# Patient Record
Sex: Male | Born: 1970 | Race: White | Hispanic: No | Marital: Married | Smoking: Never smoker
Health system: Southern US, Community
[De-identification: ages and names within clinical notes are randomized; demographics above are authoritative.]

## PROBLEM LIST (undated history)

## (undated) DIAGNOSIS — I2699 Other pulmonary embolism without acute cor pulmonale: Secondary | ICD-10-CM

## (undated) DIAGNOSIS — E119 Type 2 diabetes mellitus without complications: Secondary | ICD-10-CM

## (undated) DIAGNOSIS — M5126 Other intervertebral disc displacement, lumbar region: Secondary | ICD-10-CM

## (undated) DIAGNOSIS — I10 Essential (primary) hypertension: Secondary | ICD-10-CM

## (undated) DIAGNOSIS — Z9989 Dependence on other enabling machines and devices: Secondary | ICD-10-CM

## (undated) DIAGNOSIS — G4733 Obstructive sleep apnea (adult) (pediatric): Secondary | ICD-10-CM

## (undated) DIAGNOSIS — D6851 Activated protein C resistance: Secondary | ICD-10-CM

## (undated) DIAGNOSIS — Z86711 Personal history of pulmonary embolism: Secondary | ICD-10-CM

## (undated) DIAGNOSIS — I82409 Acute embolism and thrombosis of unspecified deep veins of unspecified lower extremity: Secondary | ICD-10-CM

## (undated) DIAGNOSIS — I82402 Acute embolism and thrombosis of unspecified deep veins of left lower extremity: Secondary | ICD-10-CM

## (undated) DIAGNOSIS — K760 Fatty (change of) liver, not elsewhere classified: Secondary | ICD-10-CM

## (undated) HISTORY — DX: Activated protein C resistance: D68.51

## (undated) HISTORY — DX: Fatty (change of) liver, not elsewhere classified: K76.0

## (undated) HISTORY — DX: Essential (primary) hypertension: I10

## (undated) HISTORY — DX: Personal history of pulmonary embolism: Z86.711

## (undated) HISTORY — DX: Dependence on other enabling machines and devices: Z99.89

## (undated) HISTORY — DX: Obstructive sleep apnea (adult) (pediatric): G47.33

## (undated) HISTORY — DX: Acute embolism and thrombosis of unspecified deep veins of left lower extremity: I82.402

---

## 2004-06-11 ENCOUNTER — Emergency Department (HOSPITAL_COMMUNITY): Admission: EM | Admit: 2004-06-11 | Discharge: 2004-06-11 | Payer: Self-pay | Admitting: Emergency Medicine

## 2013-06-29 ENCOUNTER — Other Ambulatory Visit: Payer: Self-pay | Admitting: Family Medicine

## 2013-06-29 ENCOUNTER — Ambulatory Visit
Admission: RE | Admit: 2013-06-29 | Discharge: 2013-06-29 | Disposition: A | Discharge status: Home / Self Care | Payer: BC Managed Care – PPO | Source: Ambulatory Visit | Attending: Family Medicine | Admitting: Family Medicine

## 2013-06-29 ENCOUNTER — Other Ambulatory Visit (HOSPITAL_COMMUNITY): Payer: Self-pay | Admitting: Family Medicine

## 2013-06-29 ENCOUNTER — Ambulatory Visit (HOSPITAL_COMMUNITY)
Admission: RE | Admit: 2013-06-29 | Discharge: 2013-06-29 | Disposition: A | Discharge status: Home / Self Care | Payer: BC Managed Care – PPO | Source: Ambulatory Visit | Attending: Family Medicine | Admitting: Family Medicine

## 2013-06-29 DIAGNOSIS — R0602 Shortness of breath: Secondary | ICD-10-CM

## 2013-06-29 DIAGNOSIS — I2699 Other pulmonary embolism without acute cor pulmonale: Secondary | ICD-10-CM | POA: Insufficient documentation

## 2013-06-29 DIAGNOSIS — M79662 Pain in left lower leg: Secondary | ICD-10-CM

## 2013-06-29 DIAGNOSIS — R7989 Other specified abnormal findings of blood chemistry: Secondary | ICD-10-CM

## 2013-06-29 DIAGNOSIS — M79609 Pain in unspecified limb: Secondary | ICD-10-CM

## 2013-06-29 DIAGNOSIS — M7989 Other specified soft tissue disorders: Secondary | ICD-10-CM

## 2013-06-29 MED ORDER — IOHEXOL 350 MG/ML SOLN
125.0000 mL | Freq: Once | INTRAVENOUS | Status: AC | PRN
  Administered 2013-06-29: 125 mL via INTRAVENOUS

## 2013-06-29 NOTE — Progress Notes (Signed)
VASCULAR LAB PRELIMINARY  PRELIMINARY  PRELIMINARY  PRELIMINARY  Left lower extremity venous duplex completed.    Preliminary report:  Left: DVT noted in the posterior tibial vein ankle to mid calf.  No evidence of superficial thrombosis.  No Baker's cyst.  Ceazia Harb, RVS 06/29/2013, 4:04 PM

## 2013-08-14 ENCOUNTER — Telehealth: Payer: Self-pay | Admitting: Oncology

## 2013-08-14 NOTE — Telephone Encounter (Signed)
lvom for pt to return call in re to referral.  °

## 2014-04-18 ENCOUNTER — Telehealth: Payer: Self-pay | Admitting: Internal Medicine

## 2014-04-18 NOTE — Telephone Encounter (Signed)
left message for patient to return call to schedule np appt.  °

## 2014-04-19 NOTE — Telephone Encounter (Signed)
LEFT MESSAGE FOR PATIENT TO RETURN CALL TO SCHEDULE NP APPT.  °

## 2014-06-23 ENCOUNTER — Emergency Department (HOSPITAL_BASED_OUTPATIENT_CLINIC_OR_DEPARTMENT_OTHER): Payer: BC Managed Care – PPO

## 2014-06-23 ENCOUNTER — Encounter (HOSPITAL_BASED_OUTPATIENT_CLINIC_OR_DEPARTMENT_OTHER): Payer: Self-pay | Admitting: Emergency Medicine

## 2014-06-23 ENCOUNTER — Emergency Department (HOSPITAL_BASED_OUTPATIENT_CLINIC_OR_DEPARTMENT_OTHER)
Admission: EM | Admit: 2014-06-23 | Discharge: 2014-06-23 | Disposition: A | Discharge status: Home / Self Care | Payer: BC Managed Care – PPO | Attending: Emergency Medicine | Admitting: Emergency Medicine

## 2014-06-23 DIAGNOSIS — R1012 Left upper quadrant pain: Secondary | ICD-10-CM | POA: Insufficient documentation

## 2014-06-23 DIAGNOSIS — R079 Chest pain, unspecified: Secondary | ICD-10-CM | POA: Diagnosis present

## 2014-06-23 DIAGNOSIS — R071 Chest pain on breathing: Secondary | ICD-10-CM | POA: Diagnosis not present

## 2014-06-23 DIAGNOSIS — Z880 Allergy status to penicillin: Secondary | ICD-10-CM | POA: Insufficient documentation

## 2014-06-23 DIAGNOSIS — Z86711 Personal history of pulmonary embolism: Secondary | ICD-10-CM | POA: Diagnosis not present

## 2014-06-23 DIAGNOSIS — Z79899 Other long term (current) drug therapy: Secondary | ICD-10-CM | POA: Insufficient documentation

## 2014-06-23 DIAGNOSIS — I10 Essential (primary) hypertension: Secondary | ICD-10-CM | POA: Insufficient documentation

## 2014-06-23 DIAGNOSIS — R0789 Other chest pain: Secondary | ICD-10-CM | POA: Insufficient documentation

## 2014-06-23 DIAGNOSIS — Z86718 Personal history of other venous thrombosis and embolism: Secondary | ICD-10-CM | POA: Diagnosis not present

## 2014-06-23 HISTORY — DX: Other pulmonary embolism without acute cor pulmonale: I26.99

## 2014-06-23 HISTORY — DX: Acute embolism and thrombosis of unspecified deep veins of unspecified lower extremity: I82.409

## 2014-06-23 HISTORY — DX: Essential (primary) hypertension: I10

## 2014-06-23 LAB — CBC WITH DIFFERENTIAL/PLATELET
Basophils Absolute: 0 10*3/uL (ref 0.0–0.1)
Basophils Relative: 0 % (ref 0–1)
Eosinophils Absolute: 0.1 10*3/uL (ref 0.0–0.7)
Eosinophils Relative: 2 % (ref 0–5)
HCT: 45.4 % (ref 39.0–52.0)
Hemoglobin: 15.5 g/dL (ref 13.0–17.0)
LYMPHS ABS: 2.7 10*3/uL (ref 0.7–4.0)
LYMPHS PCT: 31 % (ref 12–46)
MCH: 31.2 pg (ref 26.0–34.0)
MCHC: 34.1 g/dL (ref 30.0–36.0)
MCV: 91.3 fL (ref 78.0–100.0)
Monocytes Absolute: 0.5 10*3/uL (ref 0.1–1.0)
Monocytes Relative: 6 % (ref 3–12)
NEUTROS PCT: 61 % (ref 43–77)
Neutro Abs: 5.4 10*3/uL (ref 1.7–7.7)
Platelets: 325 10*3/uL (ref 150–400)
RBC: 4.97 MIL/uL (ref 4.22–5.81)
RDW: 12.4 % (ref 11.5–15.5)
WBC: 8.7 10*3/uL (ref 4.0–10.5)

## 2014-06-23 LAB — TROPONIN I

## 2014-06-23 LAB — LIPASE, BLOOD: Lipase: 26 U/L (ref 11–59)

## 2014-06-23 LAB — PROTIME-INR
INR: 1.75 — AB (ref 0.00–1.49)
Prothrombin Time: 20.4 seconds — ABNORMAL HIGH (ref 11.6–15.2)

## 2014-06-23 LAB — URINALYSIS, ROUTINE W REFLEX MICROSCOPIC
Bilirubin Urine: NEGATIVE
Glucose, UA: NEGATIVE mg/dL
Ketones, ur: NEGATIVE mg/dL
LEUKOCYTES UA: NEGATIVE
NITRITE: NEGATIVE
PH: 5 (ref 5.0–8.0)
Protein, ur: NEGATIVE mg/dL
Specific Gravity, Urine: 1.011 (ref 1.005–1.030)
Urobilinogen, UA: 0.2 mg/dL (ref 0.0–1.0)

## 2014-06-23 LAB — COMPREHENSIVE METABOLIC PANEL
ALT: 30 U/L (ref 0–53)
AST: 29 U/L (ref 0–37)
Albumin: 4.1 g/dL (ref 3.5–5.2)
Alkaline Phosphatase: 48 U/L (ref 39–117)
Anion gap: 15 (ref 5–15)
BUN: 15 mg/dL (ref 6–23)
CO2: 22 meq/L (ref 19–32)
Calcium: 10.1 mg/dL (ref 8.4–10.5)
Chloride: 101 mEq/L (ref 96–112)
Creatinine, Ser: 1 mg/dL (ref 0.50–1.35)
GFR calc Af Amer: >90 mL/min (ref >90)
GLUCOSE: 167 mg/dL — AB (ref 70–99)
Potassium: 4.2 mEq/L (ref 3.7–5.3)
SODIUM: 138 meq/L (ref 137–147)
TOTAL PROTEIN: 8 g/dL (ref 6.0–8.3)
Total Bilirubin: 0.4 mg/dL (ref 0.3–1.2)

## 2014-06-23 LAB — URINE MICROSCOPIC-ADD ON

## 2014-06-23 MED ORDER — IOHEXOL 350 MG/ML SOLN
100.0000 mL | Freq: Once | INTRAVENOUS | Status: AC | PRN
  Administered 2014-06-23: 100 mL via INTRAVENOUS

## 2014-06-23 NOTE — ED Provider Notes (Signed)
CSN: [id number]     Arrival date & time 06/23/14  1135 History   First MD Initiated Contact with Patient 06/23/14 1145    This chart was scribed for Glynn Octave, MD by Freida Busman, ED Scribe. This patient was seen in room MHOTF/OTF and the patient's care was started 3:58 PM.  Chief Complaint  Patient presents with  . Chest Pain      The history is provided by the patient.   HPI Comments:  Tyreon Frigon is a 43 y.o. male who presents to the Emergency Department complaining of constant moderate right sided chest pain that started this am. Pain started while helping his son get dressed, states he feels like he pulled a muscle. Pt also admits to working out yesterday but does not believe pain is related. Pain is exacerbated by certain movements and with inspiration . No alleviating factors noted.  Pt notes h/o PE, is currently on Xarelto and is compliant with med. Pt also complains of constant left sided rib pain/LUQ abdominal pain that radiates around to left flank with onset "a few weeks ago".    Past Medical History  Diagnosis Date  . Pulmonary embolism   . DVT (deep venous thrombosis)   . Hypertension    History reviewed. No pertinent past surgical history. No family history on file. History  Substance Use Topics  . Smoking status: Never Smoker   . Smokeless tobacco: Not on file  . Alcohol Use: No    Review of Systems  Cardiovascular: Positive for chest pain.  Gastrointestinal: Positive for abdominal pain.  All other systems reviewed and are negative.     Allergies  Amoxicillin  Home Medications   Prior to Admission medications   Medication Sig Start Date End Date Taking? Authorizing Provider  amLODipine (NORVASC) 10 MG tablet Take 10 mg by mouth daily.   Yes Historical Provider, MD  rivaroxaban (XARELTO) 10 MG TABS tablet Take 10 mg by mouth daily.   Yes Historical Provider, MD   BP 143/66  Pulse 67  Resp 18  SpO2 98% Physical Exam  Nursing note and vitals  reviewed. Constitutional: He is oriented to person, place, and time. He appears well-developed and well-nourished. No distress.      HENT:  Head: Normocephalic and atraumatic.  Mouth/Throat: Oropharynx is clear and moist. No oropharyngeal exudate.  Eyes: Conjunctivae and EOM are normal. Pupils are equal, round, and reactive to light.  Neck: Normal range of motion. Neck supple.  No meningismus.  Cardiovascular: Normal rate, regular rhythm, normal heart sounds and intact distal pulses.   No murmur heard. Distal pulses intact  Pulmonary/Chest: Effort normal and breath sounds normal. No respiratory distress.  Right chest wall tenderness worse with palpation and arm movement  Abdominal: Soft. There is no rebound and no guarding.  Mild LUQ and left flank tenderness  Musculoskeletal: Normal range of motion. He exhibits no edema and no tenderness.  Neurological: He is alert and oriented to person, place, and time. No cranial nerve deficit. He exhibits normal muscle tone. Coordination normal.  No ataxia on finger to nose bilaterally. No pronator drift. 5/5 strength throughout. CN 2-12 intact. Negative Romberg. Equal grip strength. Sensation intact. Gait is normal.   Skin: Skin is warm.  Psychiatric: He has a normal mood and affect. His behavior is normal.    ED Course  Procedures (including critical care time)  DIAGNOSTIC STUDIES:  Oxygen Saturation is 100% on RA, normal  by my interpretation.    COORDINATION OF  CARE:  3:58 PM Discussed treatment plan with pt at bedside and pt agreed to plan.  Labs Review Labs Reviewed  COMPREHENSIVE METABOLIC PANEL - Abnormal; Notable for the following:    Glucose, Bld 167 (*)    All other components within normal limits  URINALYSIS, ROUTINE W REFLEX MICROSCOPIC - Abnormal; Notable for the following:    Hgb urine dipstick TRACE (*)    All other components within normal limits  PROTIME-INR - Abnormal; Notable for the following:    Prothrombin Time  20.4 (*)    INR 1.75 (*)    All other components within normal limits  CBC WITH DIFFERENTIAL  LIPASE, BLOOD  TROPONIN I  URINE MICROSCOPIC-ADD ON    Imaging Review Dg Chest 2 View  06/23/2014   CLINICAL DATA:  Right-sided chest pain.  EXAM: CHEST  2 VIEW  COMPARISON:  Chest CT 06/29/2013.  FINDINGS: Lung volumes are normal. No consolidative airspace disease. No pleural effusions. No pneumothorax. No pulmonary nodule or mass noted. Pulmonary vasculature and the cardiomediastinal silhouette are within normal limits.  IMPRESSION: No radiographic evidence of acute cardiopulmonary disease.   Electronically Signed   By: Daniel  Entrikin M.D.   On: 06/23/2014 13:00   Ct Angio Chest Pe W/cm &/or Wo Cm  06/23/2014   CLINICAL DATA:  Right-sided chest pain and abdominal pain  EXAM: CT ANGIOGRAPHY CHEST  CT ABDOMEN AND PELVIS WITH CONTRAST  TECHNIQUE: Multidetector CT imaging of the chest was performed using the standard protocol during bolus administration of intravenous contrast. Multiplanar CT image reconstructions and MIPs were obtained to evaluate the vascular anatomy. Multidetector CT imaging of the abdomen and pelvis was performed using the standard protocol during bolus administration of intravenous contrast.  CONTRAST:  <MEASUREMEAkron General Medical CenTrous69daERaef<MEASUREMENTVermilion Behavioral Health SysHospital Fo13r [place] Raef<MEASUREMENTUniversity HospiOutpatient25 Raef<MEASUREMENTCom[facility] Health[facility] Rehabilitation7 HKnights LRaef<MEASUREMENT[facility] Partner41s CRaef<MEASUREMENTMercy WestbrTa47hRaef<MEASUREMENTAsh[facility] Medical CenPhysicians 110BeHockiRaef<MEASUREME69N<MEASUREMENT[facility] Health[facility] Rehabilitation43 HCornRaef<MEASUREMENTGreater Ny Endoscopy Surgical CenCts Surgical Associates LLC Dba Cedar T51reNew BerliRaef<MEASUREMENT[facility] CenUpper Bay18 SGreeleRaef<MEASUREMENTM[facility] County HospiM3erRaef<MEASUREMENTKiowa District HospiFran46klBlRaef<MEASUREMENTRenue Surgery Center Of Wayc37rCRaef<MEASUREMENTCascade Eye And Skin CentersHospital Distri4ctAmitRaef<MEASUREMENTThe Physicians' Hospital In AnadaDigestive Health E67ndMobilRaef<MEASUREMENT[facility] H Noye9Raef<MEASUREMENTMclaren Greater LansStrategic Behav52ioPlattsburgRaef<MEASUREMENTW[facility] Stadium CamBanner Gat79ewRaef<MEASUREMENT[facility] Blue BThe University Of [facility] 69PhBird Raef<MEASUREMENTSouthwest General HospiJackso26Raef<MEASUREMENTEye Care Surgery Center SouthaHan18seTainteRaef<MEASUREMENT[facility] Luke'[facility] CamAmery 74HRaef<MEASUREMENTS[facility] Facility [facility] Health Servi61ceMeaRaef<MEASUREMENTPrairie Community HospiHealtheas48t CaRaef<MEASUREMENTSt Jaeven HealthcUnion 69HeCarmel-by-tRaef<MEASUREMENTJohnson County Surgery CenterDelt3a LandRaef<MEASUREMENTNyu Winthrop-University HospiWeste31rnSpearRaef50981Carollee HerterOL 350 MG/ML SOLN  COMPARISON:  None.  FINDINGS: CTA CHEST FINDINGS  There is no pleural effusion identified. No airspace consolidation, atelectasis or mass identified.  The heart size appears normal. The trachea is midline and patent. The esophagus appears normal. No enlarged mediastinal or hilar lymph nodes. There is no axillary or supraclavicular adenopathy.  The main pulmonary artery appears patent. No lobar or segmental pulmonary artery filling defects identified to suggest an acute pulmonary embolus.  Review of the visualized bony structures is unremarkable. No aggressive lytic or sclerotic bone lesion identified.  Review of the MIP images confirms the above  findings.  CT ABDOMEN and PELVIS FINDINGS  Mild hepatic steatosis noted. There is no suspicious liver abnormality. The gallbladder appears normal. No biliary dilatation. Normal appearance of the pancreas and spleen.  The adrenal glands are both normal. Normal appearance of the right kidney. The left kidney is normal. The urinary bladder is normal. Mild prostate gland enlargement noted.  Normal caliber of the abdominal aorta. Porta hepatis lymph node measures 1.3 cm, image 23/series 13. No additional enlarged upper abdominal lymph nodes. No mesenteric adenopathy. There is no pelvic or inguinal adenopathy. No free fluid or fluid collections within the abdomen or pelvis.  The stomach appears normal. The proximal small bowel loops have a normal course and caliber. The distal small bowel including terminal ileum appear normal. The appendix is visualized and is unremarkable. Normal appearance of the proximal colon. The distal colon and rectum are unremarkable.  Small fat containing inguinal hernias noted. 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 No acute bone abnormalities identified.  IMPRESSION:  1. No acute cardiopulmonary abnormalities. No evidence for pulmonary embolus. 2.  No acute findings identified within the abdomen or pelvis. 3. Mild hepatic steatosis. 4. Borderline enlarged porta hepatis lymph node.   Electronically Signed   By: Signa Kell M.D.   On: 06/23/2014 13:58   Ct Abdomen Pelvis W Contrast  06/23/2014   CLINICAL DATA:  Right-sided chest pain and abdominal pain  EXAM: CT ANGIOGRAPHY CHEST  CT ABDOMEN AND PELVIS WITH CONTRAST  TECHNIQUE: Multidetector CT imaging of the chest was performed using the standard protocol during bolus administration of intravenous contrast. Multiplanar CT image reconstructions and MIPs were obtained to evaluate the vascular anatomy. Multidetector CT imaging of the abdomen and pelvis was  performed using the standard protocol during bolus administration of intravenous contrast.  CONTRAST:  OMNIPAQUE IOHEXOL 350 MG/ML SOLN  COMPARISON:  None.  FINDINGS: CTA CHEST FINDINGS  There is no pleural effusion identified. No airspace consolidation, atelectasis or mass identified.  The heart size appears normal. The trachea is midline and patent. The esophagus appears normal. No enlarged mediastinal or hilar lymph nodes. There is no axillary or supraclavicular adenopathy.  The main pulmonary artery appears patent. No lobar or segmental pulmonary artery filling defects identified to suggest an acute pulmonary embolus.  Review of the visualized bony structures is unremarkable. No aggressive lytic or sclerotic bone lesion identified.  Review of the MIP images confirms the above findings.  CT ABDOMEN and PELVIS FINDINGS  Mild hepatic steatosis noted. There is no suspicious liver abnormality. The gallbladder appears normal. No biliary dilatation. Normal appearance of the pancreas and spleen.  The adrenal glands are both normal. Normal appearance of the right kidney. The left kidney is normal. The urinary bladder is normal. Mild prostate gland enlargement noted.  Normal caliber of the abdominal aorta. Porta hepatis lymph node measures 1.3 cm, image 23/series 13. No additional enlarged upper abdominal lymph nodes. No mesenteric adenopathy. There is no pelvic or inguinal adenopathy. No free fluid or fluid collections within the abdomen or pelvis.  The stomach appears normal. The proximal small bowel loops have a normal course and caliber. The distal small bowel including terminal ileum appear normal. The appendix is visualized and is unremarkable. Normal appearance of the proximal colon. The distal colon and rectum are unremarkable.  Small fat containing inguinal hernias noted. 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 No acute bone  abnormalities identified.  IMPRESSION:  1. No acute cardiopulmonary abnormalities. No evidence for pulmonary embolus. 2. No acute findings identified within the abdomen or pelvis. 3. Mild hepatic steatosis. 4. Borderline enlarged porta hepatis lymph node.   Electronically Signed   By: Signa Kell M.D.   On: 06/23/2014 13:58     EKG Interpretation   Date/Time:  Sunday June 23 2014 11:42:28 EDT Ventricular Rate:  86 PR Interval:  134 QRS Duration: 90 QT Interval:  358 QTC Calculation: 428 R Axis:   65 Text Interpretation:  Normal sinus rhythm Normal ECG No previous ECGs  available Confirmed by Manus Gunning  MD, Beckett Maden [phone]) on 06/23/2014 12:18:20  PM      MDM   Final diagnoses:  Atypical chest pain   Right-sided chest pain constant since this morning after moving his disabled son. Also worked out yesterday. Pain worse with palpation and movement of right arm. History of PE on xarelto.  Second complaint is left-sided flank abdominal pain has been coming and going for 3 weeks. No vomiting.  EKG nsr. Chest xray negative. Doubt PE as patient  on xarelto and states compliance.  Atypical for ACS, right sided and reproducible. Trace hematuria in UA. Question passed stone as source of flank pain. CT negative for PE or any other abnormality.  Pain and going for the past 5 hours and constant. Negative troponin. Low suspicion for ACS. No evidence of PE, pneumothorax or pneumonia. Supportive care for likely chest wall pain. Continue xarelto. Followup with PCP. Return precautions discussed.  BP 143/66  Pulse 67  Resp 18  SpO2 98%      I personally performed the services described in this documentation, which was scribed in my presence. The recorded information has been reviewed and is accurate.    Glynn Octave, MD 06/23/14 [phone]

## 2014-06-23 NOTE — ED Notes (Signed)
CP since last night. Reports worked out last night, feeling aching pain in right side of chest. Dx with PE last year. No radiation. Reports nausea. Also sts back pain

## 2014-06-23 NOTE — Discharge Instructions (Signed)
Chest Pain (Nonspecific) °There is no evidence of heart attack or blood clot in the lung. Follow up with your doctor. Return to the ED if you develop new or worsening symptoms. °It is often hard to give a specific diagnosis for the cause of chest pain. There is always a chance that your pain could be related to something serious, such as a heart attack or a blood clot in the lungs. You need to follow up with your health care provider for further evaluation. °CAUSES  °· Heartburn. °· Pneumonia or bronchitis. °· Anxiety or stress. °· Inflammation around your heart (pericarditis) or lung (pleuritis or pleurisy). °· A blood clot in the lung. °· A collapsed lung (pneumothorax). It can develop suddenly on its own (spontaneous pneumothorax) or from trauma to the chest. °· Shingles infection (herpes zoster virus). °The chest wall is composed of bones, muscles, and cartilage. Any of these can be the source of the pain. °· The bones can be bruised by injury. °· The muscles or cartilage can be strained by coughing or overwork. °· The cartilage can be affected by inflammation and become sore (costochondritis). °DIAGNOSIS  °Lab tests or other studies may be needed to find the cause of your pain. Your health care provider may have you take a test called an ambulatory electrocardiogram (ECG). An ECG records your heartbeat patterns over a 24-hour period. You may also have other tests, such as: °· Transthoracic echocardiogram (TTE). During echocardiography, sound waves are used to evaluate how blood flows through your heart. °· Transesophageal echocardiogram (TEE). °· Cardiac monitoring. This allows your health care provider to monitor your heart rate and rhythm in real time. °· Holter monitor. This is a portable device that records your heartbeat and can help diagnose heart arrhythmias. It allows your health care provider to track your heart activity for several days, if needed. °· Stress tests by exercise or by giving medicine  that makes the heart beat faster. °TREATMENT  °· Treatment depends on what may be causing your chest pain. Treatment may include: °¨ Acid blockers for heartburn. °¨ Anti-inflammatory medicine. °¨ Pain medicine for inflammatory conditions. °¨ Antibiotics if an infection is present. °· You may be advised to change lifestyle habits. This includes stopping smoking and avoiding alcohol, caffeine, and chocolate. °· You may be advised to keep your head raised (elevated) when sleeping. This reduces the chance of acid going backward from your stomach into your esophagus. °Most of the time, nonspecific chest pain will improve within 2-3 days with rest and mild pain medicine.  °HOME CARE INSTRUCTIONS  °· If antibiotics were prescribed, take them as directed. Finish them even if you start to feel better. °· For the next few days, avoid physical activities that bring on chest pain. Continue physical activities as directed. °· Do not use any tobacco products, including cigarettes, chewing tobacco, or electronic cigarettes. °· Avoid drinking alcohol. °· Only take medicine as directed by your health care provider. °· Follow your health care provider's suggestions for further testing if your chest pain does not go away. °· Keep any follow-up appointments you made. If you do not go to an appointment, you could develop lasting (chronic) problems with pain. If there is any problem keeping an appointment, call to reschedule. °SEEK MEDICAL CARE IF:  °· Your chest pain does not go away, even after treatment. °· You have a rash with blisters on your chest. °· You have a fever. °SEEK IMMEDIATE MEDICAL CARE IF:  °· You have increased   chest pain or pain that spreads to your arm, neck, jaw, back, or abdomen. °· You have shortness of breath. °· You have an increasing cough, or you cough up blood. °· You have severe back or abdominal pain. °· You feel nauseous or vomit. °· You have severe weakness. °· You faint. °· You have chills. °This is an  emergency. Do not wait to see if the pain will go away. Get medical help at once. Call your local emergency services (911 in [place] Do not drive yourself to the hospital. °MAKE SURE YOU:  °· Understand these instructions. °· Will watch your condition. °· Will get help right away if you are not doing well or get worse. °Document Released: 07/07/2005 Document Revised: 10/02/2013 Document Reviewed: 05/02/2008 °[employer]® Patient Information ©2015 [employer]. This information is not intended to replace advice given to you by your health care provider. Make sure you discuss any questions you have with your health care provider. ° °

## 2014-06-23 NOTE — ED Notes (Signed)
MD at bedside. 

## 2014-06-23 NOTE — ED Notes (Signed)
Patient transported to CT 

## 2015-02-13 ENCOUNTER — Telehealth: Payer: Self-pay | Admitting: Oncology

## 2015-02-13 NOTE — Telephone Encounter (Signed)
Call to patient to confirm appointment for 02/17/15 at 2:30 lmtcb

## 2015-02-17 ENCOUNTER — Ambulatory Visit (INDEPENDENT_AMBULATORY_CARE_PROVIDER_SITE_OTHER): Payer: BC Managed Care – PPO | Admitting: Oncology

## 2015-02-17 ENCOUNTER — Encounter: Payer: Self-pay | Admitting: Oncology

## 2015-02-17 VITALS — BP 138/74 | HR 74 | Temp 98.2°F | Ht 69.5 in | Wt 275.9 lb

## 2015-02-17 DIAGNOSIS — D6851 Activated protein C resistance: Secondary | ICD-10-CM | POA: Insufficient documentation

## 2015-02-17 DIAGNOSIS — I824Z2 Acute embolism and thrombosis of unspecified deep veins of left distal lower extremity: Secondary | ICD-10-CM | POA: Diagnosis not present

## 2015-02-17 DIAGNOSIS — Z9989 Dependence on other enabling machines and devices: Secondary | ICD-10-CM

## 2015-02-17 DIAGNOSIS — G4733 Obstructive sleep apnea (adult) (pediatric): Secondary | ICD-10-CM

## 2015-02-17 DIAGNOSIS — I82402 Acute embolism and thrombosis of unspecified deep veins of left lower extremity: Secondary | ICD-10-CM

## 2015-02-17 DIAGNOSIS — Z7901 Long term (current) use of anticoagulants: Secondary | ICD-10-CM

## 2015-02-17 DIAGNOSIS — K760 Fatty (change of) liver, not elsewhere classified: Secondary | ICD-10-CM

## 2015-02-17 DIAGNOSIS — I2699 Other pulmonary embolism without acute cor pulmonale: Secondary | ICD-10-CM | POA: Diagnosis not present

## 2015-02-17 DIAGNOSIS — I10 Essential (primary) hypertension: Secondary | ICD-10-CM

## 2015-02-17 DIAGNOSIS — Z86711 Personal history of pulmonary embolism: Secondary | ICD-10-CM

## 2015-02-17 HISTORY — DX: Acute embolism and thrombosis of unspecified deep veins of left lower extremity: I82.402

## 2015-02-17 HISTORY — DX: Fatty (change of) liver, not elsewhere classified: K76.0

## 2015-02-17 HISTORY — DX: Essential (primary) hypertension: I10

## 2015-02-17 HISTORY — DX: Activated protein C resistance: D68.51

## 2015-02-17 HISTORY — DX: Obstructive sleep apnea (adult) (pediatric): G47.33

## 2015-02-17 HISTORY — DX: Personal history of pulmonary embolism: Z86.711

## 2015-02-17 NOTE — Patient Instructions (Signed)
Consider decrease in Xarelto to 10 mg daily Return visit with Dr Reece AgarG in 1 year

## 2015-02-17 NOTE — Progress Notes (Addendum)
Patient ID: Connor Little, male   DOB: 1971-06-22, 44 y.o.   MRN: [id number] New Patient Hematology   Connor Little [id number] 1971-06-22 44 y.o. 02/17/2015  CC: Dr. Sigmund HazelLisa Miller   Reason for referral: Advise on duration of anticoagulation   HPI:  44 year old man who has been in overall good health except for treated hypertension and obstructive sleep apnea. He has been using CPAP for the last 2 years. He presented on 06/29/2013 with a 3 to four-week history of rapidly progressive dyspnea with minimal exertion and a 1-2 week history of left calf pain. Vascular Doppler studies showed acute DVT in the posterior tibial vein to mid calf. More impressively, CT angiogram of the chest showed bilateral pulmonary emboli including clot in the left and right main pulmonary arteries. He had no history of immobilization, surgery, prolonged travel, or previous blood clots. He is a nonsmoker. No family history of clotting in his parents or 2 siblings. A paternal uncle approximately 42 years old recently had a PE. He was treated as an outpatient with Xarelto, current dose 20 mg daily, which she has been on since diagnosis. He was found to be a heterozygote for the factor V Leiden gene mutation. No other family members were tested.  He presented to the emergency department on 06/23/2014 for evaluation of chest pain. CT angios the chest did not show any new emboli at that time. Pain was felt to be musculoskeletal in nature.  He denies any current dyspnea, chest pain, palpitations, leg pain or swelling. He has no constitutional symptoms.    PMH: Past Medical History  Diagnosis Date  . Pulmonary embolism   . DVT (deep venous thrombosis)   . Hypertension   Obstructive sleep apnea as noted above. Question of renal stones. Emergency room visit for flank pain and transient hematuria with negative CT scan. No history of MI, diabetes, asthma, emphysema, tuberculosis, hepatitis, yellow jaundice, mononucleosis, thyroid  disease, prostate problems, inflammatory arthritis, seizure, or stroke.  No past surgical history on file.  Allergies: Allergies  Allergen Reactions  . Amoxicillin     Medications:  Current outpatient prescriptions:  .  amLODipine (NORVASC) 10 MG tablet, Take 10 mg by mouth daily., Disp: , Rfl:  .  rivaroxaban (XARELTO) 10 MG TABS tablet, Take 20 mg by mouth daily. , Disp: , Rfl:    Social History: Married. He has an 42-year-old son who has the fragile X syndrome. His wife is the carrier for this gene defect. 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  he has never smoked.  He reports that he drinks alcohol very rarely. He does not use any recreational drugs..  Family History: Mother age 42, father age 42, both healthy. Brother age 42, sister age 42 both healthy. His sister has had 3 children with 3 uncomplicated pregnancies.   Review of Systems: See HPI Remaining ROS negative.  Physical Exam: Blood pressure 138/74, pulse 74, temperature 98.2 F (36.8 C), temperature source Oral, height 5' 9.5" (1.765 m), weight 275 lb 14.4 oz (125.147 kg), SpO2 98 %. Wt Readings from Last 3 Encounters:  02/17/15 275 lb 14.4 oz (125.147 kg)     General appearance: Husky Caucasian man HENNT: Pharynx no erythema, exudate, mass, or ulcer. No thyromegaly or thyroid nodules Lymph nodes: No cervical, supraclavicular, or axillary lymphadenopathy Breasts:  Lungs: Clear to auscultation, resonant to percussion throughout Heart: Regular rhythm, no murmur, no gallop, no rub, no click, no edema Abdomen: Soft, nontender, normal bowel sounds, no  mass, no organomegaly Extremities: No edema, no calf tenderness Musculoskeletal: no joint deformities GU:  Vascular: Carotid pulses 2+, no bruits,  Neurologic: Alert, oriented, PERRLA, optic discs sharp and vessels normal, no hemorrhage or exudate, cranial nerves grossly normal, motor strength 5 over 5, reflexes 1+ symmetric, upper body  coordination normal, gait normal, Skin: No rash or ecchymosis    Lab Results: Lab Results  Component Value Date   WBC 8.7 06/23/2014   HGB 15.5 06/23/2014   HCT 45.4 06/23/2014   MCV 91.3 06/23/2014   PLT 325 06/23/2014     Chemistry      Component Value Date/Time   NA 138 06/23/2014 1150   K 4.2 06/23/2014 1150   CL 101 06/23/2014 1150   CO2 22 06/23/2014 1150   BUN 15 06/23/2014 1150   CREATININE 1.00 06/23/2014 1150      Component Value Date/Time   CALCIUM 10.1 06/23/2014 1150   ALKPHOS 48 06/23/2014 1150   AST 29 06/23/2014 1150   ALT 30 06/23/2014 1150   BILITOT 0.4 06/23/2014 1150       Radiological Studies: See discussion above     Impression:  Unprovoked bilateral pulmonary emboli and left lower extremity distal DVT with no obvious risk factors except for heterozygote status for the factor V Leiden gene mutation and possibly, sleep apnea syndrome.  Recommendation: We had an extended discussion about current approach to unprovoked venous thromboembolism. A number of studies reproducibly show an unacceptable recurrence rate in people who stop anticoagulation. Current guidelines recommend long-term anticoagulation with annual review of clotting versus bleeding risk. The non-Coumadin oral anticoagulants are now the recommended anticoagulants of choice for patients that need to be on long-term anticoagulation in view of a lower bleeding risk than long-term Coumadin. There is one provocative study in the literature where patients who are taking apixiban (Eliquis) for one year after their pulmonary emboli were randomized to observation versus continuing full dose apixiban versus changing to prophylactic dose which is 50% of the therapeutic dose. This study confirmed the unacceptably high rethrombosis rate in the patient's on the observation arm but showed that the patients who were randomized to the lower dose of the apixiban still have full protection against  rethrombosis with no major bleeding. Although this is only a single study, and I reinforced this to the patient, I do think it would be quite reasonable now that he has been on the Xarelto for almost 2 years, to change to Xarelto had a lower dose of 10 milligrams daily. He really likes this idea and understands that we don't have a lot of data except for the one study and with a different drug than he is taking. I will see him again in a year. He understands he can call if there are any problems in the interim.  We no longer recommend routine testing of other family members for genetic predisposing factor such as factor V Leiden unless there are extenuating circumstances.      Connor FeinsteinJAMES M Julien Berryman, MD 02/17/2015, 4:04 PM  Additional lab results: Prothrombin gene mutation: not detected. ANA negative. Anticardiolipin antibodies not elevated. Plasma homocysteine normal.  Protein C functional 143% (74-151). Antithrombin III normal. Activated Protein C resistance: 1.9 (2.0-4.0 ratio)

## 2016-03-29 ENCOUNTER — Encounter: Payer: Self-pay | Admitting: Oncology

## 2016-03-29 ENCOUNTER — Ambulatory Visit: Payer: BC Managed Care – PPO | Admitting: Oncology

## 2017-10-26 ENCOUNTER — Encounter (HOSPITAL_COMMUNITY): Payer: Self-pay | Admitting: *Deleted

## 2017-10-26 ENCOUNTER — Emergency Department (HOSPITAL_COMMUNITY)
Admission: EM | Admit: 2017-10-26 | Discharge: 2017-10-26 | Disposition: A | Discharge status: Home / Self Care | Payer: BC Managed Care – PPO | Attending: Emergency Medicine | Admitting: Emergency Medicine

## 2017-10-26 ENCOUNTER — Other Ambulatory Visit: Payer: Self-pay

## 2017-10-26 DIAGNOSIS — I10 Essential (primary) hypertension: Secondary | ICD-10-CM | POA: Insufficient documentation

## 2017-10-26 DIAGNOSIS — R609 Edema, unspecified: Secondary | ICD-10-CM | POA: Insufficient documentation

## 2017-10-26 DIAGNOSIS — R200 Anesthesia of skin: Secondary | ICD-10-CM | POA: Diagnosis not present

## 2017-10-26 DIAGNOSIS — Z7901 Long term (current) use of anticoagulants: Secondary | ICD-10-CM | POA: Diagnosis not present

## 2017-10-26 DIAGNOSIS — Z86718 Personal history of other venous thrombosis and embolism: Secondary | ICD-10-CM | POA: Insufficient documentation

## 2017-10-26 DIAGNOSIS — Z79899 Other long term (current) drug therapy: Secondary | ICD-10-CM | POA: Insufficient documentation

## 2017-10-26 DIAGNOSIS — Z86711 Personal history of pulmonary embolism: Secondary | ICD-10-CM | POA: Diagnosis not present

## 2017-10-26 DIAGNOSIS — M545 Low back pain, unspecified: Secondary | ICD-10-CM

## 2017-10-26 MED ORDER — OXYCODONE-ACETAMINOPHEN 5-325 MG PO TABS: 1.0000 | ORAL_TABLET | Freq: Four times a day (QID) | ORAL | 0 refills | Status: DC | PRN

## 2017-10-26 MED ORDER — OXYCODONE-ACETAMINOPHEN 5-325 MG PO TABS
1.0000 | ORAL_TABLET | Freq: Once | ORAL | Status: AC
  Administered 2017-10-26: 1 via ORAL
  Filled 2017-10-26: qty 1

## 2017-10-26 NOTE — ED Notes (Signed)
See APP notes for further eval.

## 2017-10-26 NOTE — ED Triage Notes (Signed)
Pt reports hx of blood clots. Pt having severe lower back pain for weeks that was radiating down his legs. Now has swelling to bilateral legs and increase in back/leg pain.

## 2017-10-26 NOTE — ED Provider Notes (Signed)
[facility] EMERGENCY DEPARTMENT Provider Note   CSN: [id number] Arrival date & time: 10/26/17  1711     History   Chief Complaint Chief Complaint  Patient presents with  . Back Pain  . Leg Swelling    HPI Connor Little. is a 47 y.o. male.  HPI   47 year old male presents today with complaints of back pain.  Patient notes a significant past medical history of the same.  Patient reports approximate 6 months ago he developed lower lumbar back pain that went away.  He notes this slowly again came back but worse than before.  Over the last several months he has had slow progression of minor numbness along the left upper thigh and pain radiating down into the bilateral proximal lower extremities.  Patient denies any significant strength deficits, denies any changes in bowel or bladder habits other than intermit.  Patient reports that he is also had more swelling recently since his back started hurting as he finds that just sitting in a chair provides the most comfortable position for him.  Patient does note a history of DVT and PE in the past.  He notes a DVT in the left lower extremity and is on Xarelto, this was likely secondary to factor V Leiden.  Patient reports that he has a follow-up appointment with neurosurgery in 5 days and would like pain medication until this follow-up.  He denies any infectious etiology.  Normal urination, some intermittent constipation.  Patient denies any significant cardiac history including heart failure, denies any shortness of breath, denies any orthopnea or upper extremity peripheral edema.  Patient denies any abdominal pain, chest pain or shortness of breath.  Past Medical History:  Diagnosis Date  . DVT (deep venous thrombosis) (HCC)   . Factor 5 Leiden mutation, heterozygous (HCC) 02/17/2015  . Hepatic steatosis 02/17/2015  . HTN (hypertension), benign 02/17/2015  . Hx of pulmonary embolus 02/17/2015   Bilateral 06/29/13  . Hypertension     . Left leg DVT (HCC) 02/17/2015   Concomitant bilateral PE 06/29/13  . OSA on CPAP 02/17/2015   Dx 2014  . Pulmonary embolism [facility])     Patient Active Problem List   Diagnosis Date Noted  . Hx of pulmonary embolus 02/17/2015  . Left leg DVT (HCC) 02/17/2015  . Factor 5 Leiden mutation, heterozygous (HCC) 02/17/2015  . HTN (hypertension), benign 02/17/2015  . OSA on CPAP 02/17/2015  . Hepatic steatosis 02/17/2015    History reviewed. No pertinent surgical history.     Home Medications    Prior to Admission medications   Medication Sig Start Date End Date Taking? Authorizing Provider  amLODipine (NORVASC) 10 MG tablet Take 10 mg by mouth daily.    [provider]  oxyCODONE-acetaminophen (PERCOCET/ROXICET) 5-325 MG tablet Take 1 tablet by mouth every 6 (six) hours as needed for severe pain. 10/26/17   Anola Mcgough, Tinnie Gens, PA-C  rivaroxaban (XARELTO) 10 MG TABS tablet Take 20 mg by mouth daily.     [provider]    Family History History reviewed. No pertinent family history.  Social History Social History   Tobacco Use  . Smoking status: Never Smoker  Substance Use Topics  . Alcohol use: Yes    Alcohol/week: 0.0 oz    Comment: Rarely.  . Drug use: Not on file     Allergies   Amoxicillin   Review of Systems Review of Systems  All other systems reviewed and are negative.    Physical  Exam Updated Vital Signs BP (!) 161/103 (BP Location: Right Arm)   Pulse 83   Temp 98.4 F (36.9 C) (Oral)   Resp 16   SpO2 94%   Physical Exam  Constitutional: He is oriented to person, place, and time. He appears well-developed and well-nourished.  HENT:  Head: Normocephalic and atraumatic.  Eyes: Conjunctivae are normal. Pupils are equal, round, and reactive to light. Right eye exhibits no discharge. Left eye exhibits no discharge. No scleral icterus.  Neck: Normal range of motion. No JVD present. No tracheal deviation present.  Cardiovascular: Normal  rate, regular rhythm, normal heart sounds and intact distal pulses. Exam reveals no gallop and no friction rub.  No murmur heard. Pulmonary/Chest: Effort normal and breath sounds normal. No stridor. No respiratory distress. He has no wheezes. He has no rales. He exhibits no tenderness.  Abdominal: Soft. He exhibits no distension and no mass. There is no tenderness. There is no rebound and no guarding. No hernia.  Musculoskeletal:  Minor bilateral 1+ pitting edema, symmetrical bilateral calves nontender to palpation, no warmth to touch  No CT or L-spine tenderness to palpation, no swelling or edema straight leg bilateral worsens back pain, otherwise negative for radicular pain  Distal sensation strength and motor function intact and equal bilateral  Patellar reflexes 2+ bilateral  Neurological: He is alert and oriented to person, place, and time. Coordination normal.  Psychiatric: He has a normal mood and affect. His behavior is normal. Judgment and thought content normal.  Nursing note and vitals reviewed.    ED Treatments / Results  Labs (all labs ordered are listed, but only abnormal results are displayed) Labs Reviewed - No data to display  EKG  EKG Interpretation None       Radiology No results found.  Procedures Procedures (including critical care time)  Medications Ordered in ED Medications  oxyCODONE-acetaminophen (PERCOCET/ROXICET) 5-325 MG per tablet 1 tablet (1 tablet Oral Given 10/26/17 1754)     Initial Impression / Assessment and Plan / ED Course  I have reviewed the triage vital signs and the nursing notes.  Pertinent labs & imaging results that were available during my care of the patient were reviewed by me and considered in my medical decision making (see chart for details).      Final Clinical Impressions(s) / ED Diagnoses   Final diagnoses:  Acute midline low back pain without sciatica  Peripheral edema    47 year old male presents today with  complaints of back pain.  This appears to be chronic in nature with slow worsening.  He does have some complaints of sensory loss over the left thigh, not present currently.  He has no significant neurological deficits that would require immediate MRI or further evaluation.  Patient had recent imaging as an outpatient with primary care with no acute abnormalities noted, repeat imaging unnecessary given his close follow-up with neurosurgery.  Patient is ambulating without significant difficulty, this is been slowly progressing.  I find that he will be stable for outpatient neurosurgical follow-up which she Artie has an appointment for.  Patient does have minor edema in his lower extremities again this is nonacute, likely dependent edema secondary to him spending most of his day in a seated position.  He has no signs or symptoms consistent with heart failure, no upper extremity edema or nephrotic type findings.  Patient will follow-up with primary care if edema continues to persist, return to the emergency room if it worsens.  Both patient and his  wife verbalized understanding and agreement to today's plan, there were given strict return precautions.  No further questions or concerns at the time of discharge.  ED Discharge Orders        Ordered    oxyCODONE-acetaminophen (PERCOCET/ROXICET) 5-325 MG tablet  Every 6 hours PRN     10/26/17 1749        Rosalio LoudHedges, Consepcion Utt, PA-C 10/26/17 2109    Linwood DibblesKnapp, Jon, MD 10/28/17 1141

## 2017-10-26 NOTE — Discharge Instructions (Signed)
Please read attached information. If you experience any new or worsening signs or symptoms please return to the emergency room for evaluation. Please follow-up with your primary care provider or specialist as discussed. Please use medication prescribed only as directed and discontinue taking if you have any concerning signs or symptoms.   °

## 2017-11-09 ENCOUNTER — Other Ambulatory Visit: Payer: Self-pay | Admitting: Neurosurgery

## 2017-11-09 DIAGNOSIS — M5442 Lumbago with sciatica, left side: Principal | ICD-10-CM

## 2017-11-09 DIAGNOSIS — M5441 Lumbago with sciatica, right side: Secondary | ICD-10-CM

## 2017-11-23 ENCOUNTER — Other Ambulatory Visit: Payer: Self-pay | Admitting: Neurosurgery

## 2017-12-28 ENCOUNTER — Encounter (HOSPITAL_COMMUNITY)
Admission: RE | Admit: 2017-12-28 | Discharge: 2017-12-28 | Disposition: A | Discharge status: Home / Self Care | Payer: BC Managed Care – PPO | Source: Ambulatory Visit | Attending: Neurosurgery | Admitting: Neurosurgery

## 2017-12-28 ENCOUNTER — Other Ambulatory Visit: Payer: Self-pay

## 2017-12-28 ENCOUNTER — Encounter (HOSPITAL_COMMUNITY): Payer: Self-pay

## 2017-12-28 DIAGNOSIS — Z86711 Personal history of pulmonary embolism: Secondary | ICD-10-CM | POA: Diagnosis not present

## 2017-12-28 DIAGNOSIS — G4733 Obstructive sleep apnea (adult) (pediatric): Secondary | ICD-10-CM | POA: Diagnosis not present

## 2017-12-28 DIAGNOSIS — E119 Type 2 diabetes mellitus without complications: Secondary | ICD-10-CM | POA: Diagnosis not present

## 2017-12-28 DIAGNOSIS — I10 Essential (primary) hypertension: Secondary | ICD-10-CM | POA: Insufficient documentation

## 2017-12-28 DIAGNOSIS — K760 Fatty (change of) liver, not elsewhere classified: Secondary | ICD-10-CM | POA: Insufficient documentation

## 2017-12-28 DIAGNOSIS — Z01812 Encounter for preprocedural laboratory examination: Secondary | ICD-10-CM | POA: Diagnosis not present

## 2017-12-28 DIAGNOSIS — Z86718 Personal history of other venous thrombosis and embolism: Secondary | ICD-10-CM | POA: Insufficient documentation

## 2017-12-28 HISTORY — DX: Type 2 diabetes mellitus without complications: E11.9

## 2017-12-28 HISTORY — DX: Other intervertebral disc displacement, lumbar region: M51.26

## 2017-12-28 LAB — COMPREHENSIVE METABOLIC PANEL
ALBUMIN: 4 g/dL (ref 3.5–5.0)
ALK PHOS: 46 U/L (ref 38–126)
ALT: 38 U/L (ref 17–63)
AST: 26 U/L (ref 15–41)
Anion gap: 9 (ref 5–15)
BILIRUBIN TOTAL: 0.5 mg/dL (ref 0.3–1.2)
BUN: 9 mg/dL (ref 6–20)
CALCIUM: 9.4 mg/dL (ref 8.9–10.3)
CO2: 26 mmol/L (ref 22–32)
Chloride: 103 mmol/L (ref 101–111)
Creatinine, Ser: 0.92 mg/dL (ref 0.61–1.24)
GFR calc Af Amer: >60 mL/min (ref >60)
GFR calc non Af Amer: >60 mL/min (ref >60)
GLUCOSE: 144 mg/dL — AB (ref 65–99)
Potassium: 4.2 mmol/L (ref 3.5–5.1)
Sodium: 138 mmol/L (ref 135–145)
TOTAL PROTEIN: 7.1 g/dL (ref 6.5–8.1)

## 2017-12-28 LAB — CBC
HCT: 48.4 % (ref 39.0–52.0)
Hemoglobin: 16 g/dL (ref 13.0–17.0)
MCH: 31.2 pg (ref 26.0–34.0)
MCHC: 33.1 g/dL (ref 30.0–36.0)
MCV: 94.3 fL (ref 78.0–100.0)
PLATELETS: 286 10*3/uL (ref 150–400)
RBC: 5.13 MIL/uL (ref 4.22–5.81)
RDW: 12.8 % (ref 11.5–15.5)
WBC: 10.7 10*3/uL — ABNORMAL HIGH (ref 4.0–10.5)

## 2017-12-28 LAB — GLUCOSE, CAPILLARY: GLUCOSE-CAPILLARY: 149 mg/dL — AB (ref 65–99)

## 2017-12-28 LAB — HEMOGLOBIN A1C
Hgb A1c MFr Bld: 6.7 % — ABNORMAL HIGH (ref 4.8–5.6)
Mean Plasma Glucose: 145.59 mg/dL

## 2017-12-28 NOTE — Progress Notes (Signed)
PCP - Dr. Kirtland BouchardK. Via- Clearance note in physical chart  Cardiologist - Denies  Chest x-ray - Denies  EKG - 10/26/17 (E)  Stress Test - Denies  ECHO - Denies  Cardiac Cath - Denies  Sleep Study - Yes- Positive CPAP - Yes- Told to bring mask dos  LABS- 12/28/17: CBC,CMP 01/04/18: PT  HA1C- 12/28/17 Fasting Blood Sugar - Today 149 Checks Blood Sugar __0___ times a day Pt has a meter, but does not check the bs. Told to follow diabetic guidelines DOS.  Anesthesia- No  Pt denies having chest pain, sob, or fever at this time. All instructions explained to the pt, with a verbal understanding of the material. Pt agrees to go over the instructions while at home for a better understanding. The opportunity to ask questions was provided.

## 2017-12-28 NOTE — Pre-Procedure Instructions (Signed)
Connor FabianJames Ricard Jr.  12/28/2017      [id number] [place]- [place], [place] [phone] [place] [id number] Phone: [phone] Fax: [phone]    Your procedure is scheduled on Wed. March 27  [facility].M[facility]  Call this number if you have problems the morning of surgery:  [phone]   Remember:  Do not eat food or drink liquids after midnight on Tues. March 26   Take these medicines the morning of surgery with A SIP OF WATER :   None               7 days prior to surgery STOP taking any Aspirin(unless otherwise instructed by your surgeon), Aleve, Naproxen, Ibuprofen, Motrin, Advil, Goody's, BC's, all herbal medications, fish oil, and all vitamins               Stop xarelto 3 days prior to surgery                     How to Manage Your Diabetes Before and After Surgery  Why is it important to control my blood sugar before and after surgery? . Improving blood sugar levels before and after surgery helps healing and can limit problems. . A way of improving blood sugar control is eating a healthy diet by: o  Eating less sugar and carbohydrates o  Increasing activity/exercise o  Talking with your doctor about reaching your blood sugar goals . High blood sugars (greater than 180 mg/dL) can raise your risk of infections and slow your recovery, so you will need to focus on controlling your diabetes during the weeks before surgery. . Make sure that the doctor who takes care of your diabetes knows about your planned surgery including the date and location.  How do I manage my blood sugar before surgery? . Check your blood sugar at least 4 times a day, starting 2 days before surgery, to make sure that the level is not too high or low. o Check your blood sugar the morning of your surgery when you wake up and every 2 hours until you get to the Short Stay unit. . If your blood sugar is less than 70 mg/dL, you will need to treat  for low blood sugar: o Do not take insulin. o Treat a low blood sugar (less than 70 mg/dL) with  cup of clear juice (cranberry or apple), 4 glucose tablets, OR glucose gel. Recheck blood sugar in 15 minutes after treatment (to make sure it is greater than 70 mg/dL). If your blood sugar is not greater than 70 mg/dL on recheck, call [phone] o  for further instructions. . Report your blood sugar to the short stay nurse when you get to Short Stay.  . If you are admitted to the hospital after surgery: o Your blood sugar will be checked by the staff and you will probably be given insulin after surgery (instead of oral diabetes medicines) to make sure you have good blood sugar levels. o The goal for blood sugar control after surgery is 80-180 mg/dL.       WHAT DO I DO ABOUT MY DIABETES MEDICATION?   Marland Kitchen. Do not take oral diabetes medicines (pills) the morning of surgery.     Do not wear jewelry, make-up or nail polish.  Do not wear lotions, powders, or perfumes, or deodorant.  Do not shave 48 hours prior to surgery.  Men may  shave face and neck.  Do not bring valuables to the hospital.  [facility] is not responsible for any belongings or valuables.  Contacts, dentures or bridgework may not be worn into surgery.  Leave your suitcase in the car.  After surgery it may be brought to your room.  For patients admitted to the hospital, discharge time will be determined by your treatment team.  Patients discharged the day of surgery will not be allowed to drive home.    Special instructions:   [place]- Preparing For Surgery  Before surgery, you can play an important role. Because skin is not sterile, your skin needs to be as free of germs as possible. You can reduce the number of germs on your skin by washing with CHG (chlorahexidine gluconate) Soap before surgery.  CHG is an antiseptic cleaner which kills germs and bonds with the skin to continue killing germs even after  washing.  Please do not use if you have an allergy to CHG or antibacterial soaps. If your skin becomes reddened/irritated stop using the CHG.  Do not shave (including legs and underarms) for at least 48 hours prior to first CHG shower. It is OK to shave your face.  Please follow these instructions carefully.   1. Shower the NIGHT BEFORE SURGERY and the MORNING OF SURGERY with CHG.   2. If you chose to wash your hair, wash your hair first as usual with your normal shampoo.  3. After you shampoo, rinse your hair and body thoroughly to remove the shampoo.  4. Use CHG as you would any other liquid soap. You can apply CHG directly to the skin and wash gently with a scrungie or a clean washcloth.   5. Apply the CHG Soap to your body ONLY FROM THE NECK DOWN.  Do not use on open wounds or open sores. Avoid contact with your eyes, ears, mouth and genitals (private parts). Wash Face and genitals (private parts)  with your normal soap.  6. Wash thoroughly, paying special attention to the area where your surgery will be performed.  7. Thoroughly rinse your body with warm water from the neck down.  8. DO NOT shower/wash with your normal soap after using and rinsing off the CHG Soap.  9. Pat yourself dry with a CLEAN TOWEL.  10. Wear CLEAN PAJAMAS to bed the night before surgery, wear comfortable clothes the morning of surgery  11. Place CLEAN SHEETS on your bed the night of your first shower and DO NOT SLEEP WITH PETS.    Day of Surgery: Do not apply any deodorants/lotions. Please wear clean clothes to the hospital/surgery center.      Please read over the following fact sheets that you were given. Coughing and Deep Breathing, MRSA Information and Surgical Site Infection Prevention

## 2017-12-29 LAB — SURGICAL PCR SCREEN
MRSA, PCR: POSITIVE — AB
STAPHYLOCOCCUS AUREUS: POSITIVE — AB

## 2018-01-03 MED ORDER — VANCOMYCIN HCL 10 G IV SOLR
1500.0000 mg | INTRAVENOUS | Status: AC
  Administered 2018-01-04: 1500 mg via INTRAVENOUS
  Filled 2018-01-03: qty 1500

## 2018-01-03 NOTE — Anesthesia Preprocedure Evaluation (Addendum)
Anesthesia Evaluation  Patient identified by MRN, date of birth, ID band Patient awake    Reviewed: Allergy & Precautions, NPO status , Patient's Chart, lab work & pertinent test results  History of Anesthesia Complications Negative for: history of anesthetic complications  Airway Mallampati: II  TM Distance: >3 FB Neck ROM: Full    Dental  (+) Dental Advisory Given   Pulmonary sleep apnea and Continuous Positive Airway Pressure Ventilation , PE   breath sounds clear to auscultation       Cardiovascular hypertension, Pt. on medications (-) angina+ Peripheral Vascular Disease   Rhythm:Regular Rate:Normal     Neuro/Psych negative neurological ROS     GI/Hepatic negative GI ROS, Neg liver ROS,   Endo/Other  diabetes, Oral Hypoglycemic AgentsMorbid obesity  Renal/GU negative Renal ROS     Musculoskeletal   Abdominal (+) + obese,   Peds  Hematology  (+) Blood dyscrasia (Factor V Leiden), , Xarelto: last dose Friday am   Anesthesia Other Findings   Reproductive/Obstetrics                            Anesthesia Physical Anesthesia Plan  ASA: III  Anesthesia Plan: General   Post-op Pain Management:    Induction: Intravenous  PONV Risk Score and Plan: 3 and Ondansetron, Dexamethasone and Scopolamine patch - Pre-op  Airway Management Planned: Oral ETT  Additional Equipment:   Intra-op Plan:   Post-operative Plan: Extubation in OR  Informed Consent: I have reviewed the patients History and Physical, chart, labs and discussed the procedure including the risks, benefits and alternatives for the proposed anesthesia with the patient or authorized representative who has indicated his/her understanding and acceptance.   Dental advisory given  Plan Discussed with: CRNA and Surgeon  Anesthesia Plan Comments: (Plan routine monitors, GETA)        Anesthesia Quick Evaluation

## 2018-01-04 ENCOUNTER — Encounter (HOSPITAL_COMMUNITY)
Admission: RE | Disposition: A | Discharge status: Home / Self Care | Payer: Self-pay | Source: Ambulatory Visit | Attending: Neurosurgery

## 2018-01-04 ENCOUNTER — Ambulatory Visit (HOSPITAL_COMMUNITY): Payer: BC Managed Care – PPO

## 2018-01-04 ENCOUNTER — Encounter (HOSPITAL_COMMUNITY): Payer: Self-pay | Admitting: Urology

## 2018-01-04 ENCOUNTER — Ambulatory Visit (HOSPITAL_COMMUNITY): Payer: BC Managed Care – PPO | Admitting: Certified Registered Nurse Anesthetist

## 2018-01-04 ENCOUNTER — Observation Stay (HOSPITAL_COMMUNITY)
Admission: RE | Admit: 2018-01-04 | Discharge: 2018-01-04 | Disposition: A | Discharge status: Home / Self Care | Payer: BC Managed Care – PPO | Source: Ambulatory Visit | Attending: Neurosurgery | Admitting: Neurosurgery

## 2018-01-04 DIAGNOSIS — D6851 Activated protein C resistance: Secondary | ICD-10-CM | POA: Diagnosis not present

## 2018-01-04 DIAGNOSIS — G4733 Obstructive sleep apnea (adult) (pediatric): Secondary | ICD-10-CM | POA: Insufficient documentation

## 2018-01-04 DIAGNOSIS — Z833 Family history of diabetes mellitus: Secondary | ICD-10-CM | POA: Diagnosis not present

## 2018-01-04 DIAGNOSIS — Z7901 Long term (current) use of anticoagulants: Secondary | ICD-10-CM | POA: Diagnosis not present

## 2018-01-04 DIAGNOSIS — Z8249 Family history of ischemic heart disease and other diseases of the circulatory system: Secondary | ICD-10-CM | POA: Diagnosis not present

## 2018-01-04 DIAGNOSIS — E119 Type 2 diabetes mellitus without complications: Secondary | ICD-10-CM | POA: Insufficient documentation

## 2018-01-04 DIAGNOSIS — Z86718 Personal history of other venous thrombosis and embolism: Secondary | ICD-10-CM | POA: Insufficient documentation

## 2018-01-04 DIAGNOSIS — Z86711 Personal history of pulmonary embolism: Secondary | ICD-10-CM | POA: Diagnosis not present

## 2018-01-04 DIAGNOSIS — K760 Fatty (change of) liver, not elsewhere classified: Secondary | ICD-10-CM | POA: Insufficient documentation

## 2018-01-04 DIAGNOSIS — M21372 Foot drop, left foot: Secondary | ICD-10-CM | POA: Diagnosis not present

## 2018-01-04 DIAGNOSIS — Z880 Allergy status to penicillin: Secondary | ICD-10-CM | POA: Insufficient documentation

## 2018-01-04 DIAGNOSIS — M5126 Other intervertebral disc displacement, lumbar region: Secondary | ICD-10-CM | POA: Diagnosis present

## 2018-01-04 DIAGNOSIS — Z7984 Long term (current) use of oral hypoglycemic drugs: Secondary | ICD-10-CM | POA: Diagnosis not present

## 2018-01-04 DIAGNOSIS — I739 Peripheral vascular disease, unspecified: Secondary | ICD-10-CM | POA: Insufficient documentation

## 2018-01-04 DIAGNOSIS — Z79899 Other long term (current) drug therapy: Secondary | ICD-10-CM | POA: Insufficient documentation

## 2018-01-04 DIAGNOSIS — M48061 Spinal stenosis, lumbar region without neurogenic claudication: Secondary | ICD-10-CM | POA: Diagnosis not present

## 2018-01-04 DIAGNOSIS — I10 Essential (primary) hypertension: Secondary | ICD-10-CM | POA: Insufficient documentation

## 2018-01-04 DIAGNOSIS — M5117 Intervertebral disc disorders with radiculopathy, lumbosacral region: Secondary | ICD-10-CM | POA: Diagnosis not present

## 2018-01-04 DIAGNOSIS — Z419 Encounter for procedure for purposes other than remedying health state, unspecified: Secondary | ICD-10-CM

## 2018-01-04 HISTORY — PX: LUMBAR LAMINECTOMY/DECOMPRESSION MICRODISCECTOMY: SHX5026

## 2018-01-04 LAB — PROTIME-INR
INR: 0.94
PROTHROMBIN TIME: 12.4 s (ref 11.4–15.2)

## 2018-01-04 LAB — GLUCOSE, CAPILLARY
GLUCOSE-CAPILLARY: 149 mg/dL — AB (ref 65–99)
Glucose-Capillary: 125 mg/dL — ABNORMAL HIGH (ref 65–99)
Glucose-Capillary: 145 mg/dL — ABNORMAL HIGH (ref 65–99)
Glucose-Capillary: 135 mg/dL — ABNORMAL HIGH (ref 65–99)

## 2018-01-04 SURGERY — LUMBAR LAMINECTOMY/DECOMPRESSION MICRODISCECTOMY 2 LEVELS
Anesthesia: General | Site: Spine Lumbar | Laterality: Bilateral

## 2018-01-04 MED ORDER — PROPOFOL 10 MG/ML IV BOLUS
INTRAVENOUS | Status: AC
  Filled 2018-01-04: qty 40

## 2018-01-04 MED ORDER — FENTANYL CITRATE (PF) 100 MCG/2ML IJ SOLN
INTRAMUSCULAR | Status: DC | PRN
  Administered 2018-01-04: 100 ug via INTRAVENOUS
  Administered 2018-01-04: 150 ug via INTRAVENOUS
  Administered 2018-01-04: 50 ug via INTRAVENOUS

## 2018-01-04 MED ORDER — ONDANSETRON HCL 4 MG/2ML IJ SOLN
INTRAMUSCULAR | Status: DC | PRN
  Administered 2018-01-04: 4 mg via INTRAVENOUS

## 2018-01-04 MED ORDER — OXYCODONE HCL 5 MG PO TABS: 5.0000 mg | ORAL_TABLET | ORAL | Status: DC | PRN

## 2018-01-04 MED ORDER — BACITRACIN ZINC 500 UNIT/GM EX OINT
TOPICAL_OINTMENT | CUTANEOUS | Status: AC
  Filled 2018-01-04: qty 28.35

## 2018-01-04 MED ORDER — ACETAMINOPHEN 650 MG RE SUPP: 650.0000 mg | RECTAL | Status: DC | PRN

## 2018-01-04 MED ORDER — BISACODYL 10 MG RE SUPP: 10.0000 mg | Freq: Every day | RECTAL | Status: DC | PRN

## 2018-01-04 MED ORDER — ROCURONIUM BROMIDE 10 MG/ML (PF) SYRINGE
PREFILLED_SYRINGE | INTRAVENOUS | Status: AC
  Filled 2018-01-04: qty 5

## 2018-01-04 MED ORDER — LACTATED RINGERS IV SOLN
INTRAVENOUS | Status: DC
  Administered 2018-01-04: 06:00:00 via INTRAVENOUS

## 2018-01-04 MED ORDER — CHLORHEXIDINE GLUCONATE CLOTH 2 % EX PADS: 6.0000 | MEDICATED_PAD | Freq: Once | CUTANEOUS | Status: DC

## 2018-01-04 MED ORDER — ONDANSETRON HCL 4 MG/2ML IJ SOLN
INTRAMUSCULAR | Status: AC
  Filled 2018-01-04: qty 2

## 2018-01-04 MED ORDER — MENTHOL 3 MG MT LOZG: 1.0000 | LOZENGE | OROMUCOSAL | Status: DC | PRN

## 2018-01-04 MED ORDER — PROMETHAZINE HCL 25 MG/ML IJ SOLN: 6.2500 mg | INTRAMUSCULAR | Status: DC | PRN

## 2018-01-04 MED ORDER — MEPERIDINE HCL 50 MG/ML IJ SOLN: 6.2500 mg | INTRAMUSCULAR | Status: DC | PRN

## 2018-01-04 MED ORDER — VANCOMYCIN HCL 10 G IV SOLR
1500.0000 mg | Freq: Once | INTRAVENOUS | Status: DC
  Filled 2018-01-04: qty 1500

## 2018-01-04 MED ORDER — DEXAMETHASONE SODIUM PHOSPHATE 10 MG/ML IJ SOLN
INTRAMUSCULAR | Status: DC | PRN
  Administered 2018-01-04: 10 mg via INTRAVENOUS

## 2018-01-04 MED ORDER — ACETAMINOPHEN 10 MG/ML IV SOLN
INTRAVENOUS | Status: DC | PRN
  Administered 2018-01-04: 1000 mg via INTRAVENOUS

## 2018-01-04 MED ORDER — ONDANSETRON HCL 4 MG PO TABS: 4.0000 mg | ORAL_TABLET | Freq: Four times a day (QID) | ORAL | Status: DC | PRN

## 2018-01-04 MED ORDER — ARTIFICIAL TEARS OPHTHALMIC OINT
TOPICAL_OINTMENT | OPHTHALMIC | Status: DC | PRN
  Administered 2018-01-04: 1 via OPHTHALMIC

## 2018-01-04 MED ORDER — CHLORHEXIDINE GLUCONATE CLOTH 2 % EX PADS: 6.0000 | MEDICATED_PAD | Freq: Every day | CUTANEOUS | Status: DC

## 2018-01-04 MED ORDER — ACETAMINOPHEN 500 MG PO TABS
1000.0000 mg | ORAL_TABLET | Freq: Four times a day (QID) | ORAL | Status: DC
  Administered 2018-01-04: 1000 mg via ORAL
  Filled 2018-01-04: qty 2

## 2018-01-04 MED ORDER — FENTANYL CITRATE (PF) 250 MCG/5ML IJ SOLN
INTRAMUSCULAR | Status: AC
  Filled 2018-01-04: qty 5

## 2018-01-04 MED ORDER — DOCUSATE SODIUM 100 MG PO CAPS: 100.0000 mg | ORAL_CAPSULE | Freq: Two times a day (BID) | ORAL | Status: DC

## 2018-01-04 MED ORDER — TIZANIDINE HCL 4 MG PO TABS: 4.0000 mg | ORAL_TABLET | Freq: Four times a day (QID) | ORAL | 0 refills | Status: DC | PRN

## 2018-01-04 MED ORDER — LIDOCAINE HCL (CARDIAC) 20 MG/ML IV SOLN
INTRAVENOUS | Status: DC | PRN
  Administered 2018-01-04: 20 mg via INTRAVENOUS

## 2018-01-04 MED ORDER — SODIUM CHLORIDE 0.9 % IR SOLN
Status: DC | PRN
  Administered 2018-01-04: 500 mL

## 2018-01-04 MED ORDER — LOSARTAN POTASSIUM 50 MG PO TABS: 50.0000 mg | ORAL_TABLET | Freq: Every evening | ORAL | Status: DC

## 2018-01-04 MED ORDER — ARTIFICIAL TEARS OPHTHALMIC OINT
TOPICAL_OINTMENT | OPHTHALMIC | Status: AC
  Filled 2018-01-04: qty 3.5

## 2018-01-04 MED ORDER — BUPIVACAINE-EPINEPHRINE (PF) 0.5% -1:200000 IJ SOLN
INTRAMUSCULAR | Status: DC | PRN
  Administered 2018-01-04: 30 mL

## 2018-01-04 MED ORDER — METFORMIN HCL ER 500 MG PO TB24: 750.0000 mg | ORAL_TABLET | Freq: Every day | ORAL | Status: DC

## 2018-01-04 MED ORDER — SCOPOLAMINE 1 MG/3DAYS TD PT72
MEDICATED_PATCH | TRANSDERMAL | Status: DC | PRN
  Administered 2018-01-04: 1 via TRANSDERMAL

## 2018-01-04 MED ORDER — ACETAMINOPHEN 325 MG PO TABS: 650.0000 mg | ORAL_TABLET | ORAL | Status: DC | PRN

## 2018-01-04 MED ORDER — ACETAMINOPHEN 10 MG/ML IV SOLN
INTRAVENOUS | Status: AC
  Filled 2018-01-04: qty 100

## 2018-01-04 MED ORDER — OXYCODONE HCL 5 MG PO TABS: 5.0000 mg | ORAL_TABLET | Freq: Four times a day (QID) | ORAL | 0 refills | Status: DC | PRN

## 2018-01-04 MED ORDER — MIDAZOLAM HCL 2 MG/2ML IJ SOLN
INTRAMUSCULAR | Status: AC
  Filled 2018-01-04: qty 2

## 2018-01-04 MED ORDER — CYCLOBENZAPRINE HCL 10 MG PO TABS: 10.0000 mg | ORAL_TABLET | Freq: Three times a day (TID) | ORAL | Status: DC | PRN

## 2018-01-04 MED ORDER — ONDANSETRON HCL 4 MG/2ML IJ SOLN: 4.0000 mg | Freq: Four times a day (QID) | INTRAMUSCULAR | Status: DC | PRN

## 2018-01-04 MED ORDER — PROPOFOL 10 MG/ML IV BOLUS
INTRAVENOUS | Status: DC | PRN
  Administered 2018-01-04: 200 mg via INTRAVENOUS

## 2018-01-04 MED ORDER — HEMOSTATIC AGENTS (NO CHARGE) OPTIME
TOPICAL | Status: DC | PRN
  Administered 2018-01-04: 1 via TOPICAL

## 2018-01-04 MED ORDER — DEXAMETHASONE SODIUM PHOSPHATE 10 MG/ML IJ SOLN
INTRAMUSCULAR | Status: AC
  Filled 2018-01-04: qty 1

## 2018-01-04 MED ORDER — SUGAMMADEX SODIUM 500 MG/5ML IV SOLN
INTRAVENOUS | Status: DC | PRN
  Administered 2018-01-04: 300 mg via INTRAVENOUS

## 2018-01-04 MED ORDER — SODIUM CHLORIDE 0.9% FLUSH: 3.0000 mL | INTRAVENOUS | Status: DC | PRN

## 2018-01-04 MED ORDER — BACITRACIN ZINC 500 UNIT/GM EX OINT
TOPICAL_OINTMENT | CUTANEOUS | Status: DC | PRN
  Administered 2018-01-04: 1 via TOPICAL

## 2018-01-04 MED ORDER — HYDROMORPHONE HCL 1 MG/ML IJ SOLN: 0.2500 mg | INTRAMUSCULAR | Status: DC | PRN

## 2018-01-04 MED ORDER — 0.9 % SODIUM CHLORIDE (POUR BTL) OPTIME
TOPICAL | Status: DC | PRN
  Administered 2018-01-04: 1000 mL

## 2018-01-04 MED ORDER — BUPIVACAINE-EPINEPHRINE (PF) 0.5% -1:200000 IJ SOLN
INTRAMUSCULAR | Status: AC
  Filled 2018-01-04: qty 30

## 2018-01-04 MED ORDER — MIDAZOLAM HCL 5 MG/5ML IJ SOLN
INTRAMUSCULAR | Status: DC | PRN
  Administered 2018-01-04: 2 mg via INTRAVENOUS

## 2018-01-04 MED ORDER — KETOROLAC TROMETHAMINE 30 MG/ML IJ SOLN
INTRAMUSCULAR | Status: DC | PRN
  Administered 2018-01-04: 30 mg via INTRAVENOUS

## 2018-01-04 MED ORDER — THROMBIN (RECOMBINANT) 5000 UNITS EX SOLR
CUTANEOUS | Status: DC | PRN
  Administered 2018-01-04 (×2): 5000 [IU] via TOPICAL

## 2018-01-04 MED ORDER — MORPHINE SULFATE (PF) 4 MG/ML IV SOLN: 4.0000 mg | INTRAVENOUS | Status: DC | PRN

## 2018-01-04 MED ORDER — OXYCODONE HCL 5 MG PO TABS: 10.0000 mg | ORAL_TABLET | ORAL | Status: DC | PRN

## 2018-01-04 MED ORDER — LIDOCAINE HCL (CARDIAC) 20 MG/ML IV SOLN
INTRAVENOUS | Status: AC
  Filled 2018-01-04: qty 5

## 2018-01-04 MED ORDER — SODIUM CHLORIDE 0.9% FLUSH: 3.0000 mL | Freq: Two times a day (BID) | INTRAVENOUS | Status: DC

## 2018-01-04 MED ORDER — ROCURONIUM BROMIDE 100 MG/10ML IV SOLN
INTRAVENOUS | Status: DC | PRN
  Administered 2018-01-04: 20 mg via INTRAVENOUS
  Administered 2018-01-04: 50 mg via INTRAVENOUS
  Administered 2018-01-04: 20 mg via INTRAVENOUS
  Administered 2018-01-04: 10 mg via INTRAVENOUS

## 2018-01-04 MED ORDER — PHENOL 1.4 % MT LIQD
1.0000 | OROMUCOSAL | Status: DC | PRN
Start: 2018-01-04 — End: 2018-01-04

## 2018-01-04 MED ORDER — THROMBIN 5000 UNITS EX SOLR
CUTANEOUS | Status: AC
  Filled 2018-01-04: qty 15000

## 2018-01-04 MED ORDER — MIDAZOLAM HCL 2 MG/2ML IJ SOLN: 0.5000 mg | Freq: Once | INTRAMUSCULAR | Status: DC | PRN

## 2018-01-04 SURGICAL SUPPLY — 55 items
BAG DECANTER FOR FLEXI CONT (MISCELLANEOUS) ×3 IMPLANT
BENZOIN TINCTURE PRP APPL 2/3 (GAUZE/BANDAGES/DRESSINGS) ×3 IMPLANT
BLADE CLIPPER SURG (BLADE) IMPLANT
BUR MATCHSTICK NEURO 3.0 LAGG (BURR) ×3 IMPLANT
BUR PRECISION FLUTE 6.0 (BURR) ×3 IMPLANT
CANISTER SUCT 3000ML PPV (MISCELLANEOUS) ×3 IMPLANT
CARTRIDGE OIL MAESTRO DRILL (MISCELLANEOUS) ×1 IMPLANT
CLOSURE STERI-STRIP 1/2X4 (GAUZE/BANDAGES/DRESSINGS) ×1
CLOSURE WOUND 1/2 X4 (GAUZE/BANDAGES/DRESSINGS) ×1
CLSR STERI-STRIP ANTIMIC 1/2X4 (GAUZE/BANDAGES/DRESSINGS) ×2 IMPLANT
DIFFUSER DRILL AIR PNEUMATIC (MISCELLANEOUS) ×3 IMPLANT
DRAPE LAPAROTOMY 100X72X124 (DRAPES) ×3 IMPLANT
DRAPE MICROSCOPE LEICA (MISCELLANEOUS) ×3 IMPLANT
DRAPE POUCH INSTRU U-SHP 10X18 (DRAPES) ×3 IMPLANT
DRAPE SURG 17X23 STRL (DRAPES) ×12 IMPLANT
DRSG OPSITE POSTOP 3X4 (GAUZE/BANDAGES/DRESSINGS) ×3 IMPLANT
DRSG OPSITE POSTOP 4X6 (GAUZE/BANDAGES/DRESSINGS) ×3 IMPLANT
ELECT BLADE 4.0 EZ CLEAN MEGAD (MISCELLANEOUS) ×3
ELECT REM PT RETURN 9FT ADLT (ELECTROSURGICAL) ×3
ELECTRODE BLDE 4.0 EZ CLN MEGD (MISCELLANEOUS) ×1 IMPLANT
ELECTRODE REM PT RTRN 9FT ADLT (ELECTROSURGICAL) ×1 IMPLANT
GAUZE SPONGE 4X4 12PLY STRL (GAUZE/BANDAGES/DRESSINGS) ×3 IMPLANT
GAUZE SPONGE 4X4 16PLY XRAY LF (GAUZE/BANDAGES/DRESSINGS) IMPLANT
GLOVE BIO SURGEON STRL SZ8 (GLOVE) ×3 IMPLANT
GLOVE BIO SURGEON STRL SZ8.5 (GLOVE) ×3 IMPLANT
GLOVE BIOGEL PI IND STRL 7.0 (GLOVE) ×4 IMPLANT
GLOVE BIOGEL PI IND STRL 7.5 (GLOVE) ×2 IMPLANT
GLOVE BIOGEL PI INDICATOR 7.0 (GLOVE) ×8
GLOVE BIOGEL PI INDICATOR 7.5 (GLOVE) ×4
GLOVE EXAM NITRILE LRG STRL (GLOVE) IMPLANT
GLOVE EXAM NITRILE XL STR (GLOVE) IMPLANT
GLOVE EXAM NITRILE XS STR PU (GLOVE) IMPLANT
GOWN STRL REUS W/ TWL LRG LVL3 (GOWN DISPOSABLE) IMPLANT
GOWN STRL REUS W/ TWL XL LVL3 (GOWN DISPOSABLE) ×3 IMPLANT
GOWN STRL REUS W/TWL 2XL LVL3 (GOWN DISPOSABLE) IMPLANT
GOWN STRL REUS W/TWL LRG LVL3 (GOWN DISPOSABLE)
GOWN STRL REUS W/TWL XL LVL3 (GOWN DISPOSABLE) ×6
KIT BASIN OR (CUSTOM PROCEDURE TRAY) ×3 IMPLANT
KIT TURNOVER KIT B (KITS) ×3 IMPLANT
NEEDLE HYPO 21X1.5 SAFETY (NEEDLE) IMPLANT
NEEDLE HYPO 22GX1.5 SAFETY (NEEDLE) ×3 IMPLANT
NS IRRIG 1000ML POUR BTL (IV SOLUTION) ×3 IMPLANT
OIL CARTRIDGE MAESTRO DRILL (MISCELLANEOUS) ×3
PACK LAMINECTOMY NEURO (CUSTOM PROCEDURE TRAY) ×3 IMPLANT
PAD ARMBOARD 7.5X6 YLW CONV (MISCELLANEOUS) ×9 IMPLANT
PATTIES SURGICAL .5 X1 (DISPOSABLE) IMPLANT
RUBBERBAND STERILE (MISCELLANEOUS) ×6 IMPLANT
SPONGE SURGIFOAM ABS GEL SZ50 (HEMOSTASIS) ×3 IMPLANT
STRIP CLOSURE SKIN 1/2X4 (GAUZE/BANDAGES/DRESSINGS) ×2 IMPLANT
SUT VIC AB 1 CT1 18XBRD ANBCTR (SUTURE) ×2 IMPLANT
SUT VIC AB 1 CT1 8-18 (SUTURE) ×4
SUT VIC AB 2-0 CP2 18 (SUTURE) ×6 IMPLANT
TOWEL GREEN STERILE (TOWEL DISPOSABLE) ×3 IMPLANT
TOWEL GREEN STERILE FF (TOWEL DISPOSABLE) ×3 IMPLANT
WATER STERILE IRR 1000ML POUR (IV SOLUTION) ×3 IMPLANT

## 2018-01-04 NOTE — Evaluation (Signed)
Physical Therapy Evaluation & Discharge Patient Details Name: Connor MannsJames A Damman Jr. MRN: [id number] DOB: [birth year] Today's Date: 01/04/2018   History of Present Illness  Pt is a 47 y/o male s/p microdiscectomy bilaterally at L4-L5 and on the L at L5-S1. PMH including but not limited to DM and HTN.  Clinical Impression  Pt presented supine in bed with HOB elevated, awake and willing to participate in therapy session. Pt's spouse present throughout as well. Prior to admission, pt reported that he was independent with all functional mobility and ADLs. Pt lives in a single level home with his wife and son. He will have 24/7 supervision/assistance for approximately one week upon d/c home. Pt currently able to perform bed mobility with min A, transfers with min guard and ambulated in hallway with supervision for safety. PT reviewed general walking program for pt to initiate upon d/c home as well. PT gave pt back precautions handout and reviewed in full. PT answered all of pt's questions at the end of the session. No further acute PT needs identified at this time. PT signing off.     Follow Up Recommendations No PT follow up    Equipment Recommendations  None recommended by PT    Recommendations for Other Services       Precautions / Restrictions Precautions Precautions: Back Precaution Booklet Issued: Yes (comment) Precaution Comments: Reviewed back precautions handout in full with pt and pt's spouse Restrictions Weight Bearing Restrictions: No      Mobility  Bed Mobility Overal bed mobility: Needs Assistance Bed Mobility: Rolling;Sidelying to Sit;Sit to Sidelying Rolling: Min assist Sidelying to sit: Min assist     Sit to sidelying: Supervision General bed mobility comments: increased time and effort, cueing for log roll technique, assist with LEs  Transfers Overall transfer level: Needs assistance Equipment used: None Transfers: Sit to/from Stand Sit to Stand: Min guard          General transfer comment: increased time, min guard for safety  Ambulation/Gait Ambulation/Gait assistance: Supervision Ambulation Distance (Feet): 400 Feet Assistive device: None Gait Pattern/deviations: Step-through pattern Gait velocity: decreased Gait velocity interpretation: Below normal speed for age/gender General Gait Details: steady, cautious gait without use of an AD; no instability or need for physical assistance, supervision for safety  Stairs            Wheelchair Mobility    Modified Rankin (Stroke Patients Only)       Balance Overall balance assessment: Needs assistance Sitting-balance support: Feet supported Sitting balance-Leahy Scale: Good     Standing balance support: During functional activity;No upper extremity supported Standing balance-Leahy Scale: Good                               Pertinent Vitals/Pain Pain Assessment: No/denies pain    Home Living Family/patient expects to be discharged to:: Private residence Living Arrangements: Spouse/significant other;Children Available Help at Discharge: Family;Available 24 hours/day Type of Home: House Home Access: Level entry     Home Layout: One level Home Equipment: Walker - 2 wheels;Hand held shower head;Shower seat;Grab bars - tub/shower      Prior Function Level of Independence: Independent         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     Higher education careers adviserHand Dominance        Extremity/Trunk Assessment   Upper Extremity Assessment Upper Extremity Assessment: Overall WFL for tasks assessed    Lower Extremity Assessment Lower Extremity  Assessment: Overall WFL for tasks assessed    Cervical / Trunk Assessment Cervical / Trunk Assessment: Other exceptions  Communication   Communication: No difficulties  Cognition Arousal/Alertness: Awake/alert Behavior During Therapy: Anxious Overall Cognitive Status: Within Functional Limits for tasks assessed                                         General Comments      Exercises     Assessment/Plan    PT Assessment Patent does not need any further PT services  PT Problem List         PT Treatment Interventions      PT Goals (Current goals can be found in the Care Plan section)  Acute Rehab PT Goals Patient Stated Goal: return to independence and caring for his son    Frequency     Barriers to discharge        Co-evaluation               AM-PAC PT "6 Clicks" Daily Activity  Outcome Measure Difficulty turning over in bed (including adjusting bedclothes, sheets and blankets)?: Unable Difficulty moving from lying on back to sitting on the side of the bed? : Unable Difficulty sitting down on and standing up from a chair with arms (e.g., wheelchair, bedside commode, etc,.)?: A Little Help needed moving to and from a bed to chair (including a wheelchair)?: None Help needed walking in hospital room?: None Help needed climbing 3-5 steps with a railing? : None 6 Click Score: 17    End of Session Equipment Utilized During Treatment: Gait belt Activity Tolerance: Patient tolerated treatment well Patient left: in bed;with call bell/phone within reach;with family/visitor present;with SCD's reapplied Nurse Communication: Mobility status PT Visit Diagnosis: Other abnormalities of gait and mobility (R26.89)    Time: 6962-9528 PT Time Calculation (min) (ACUTE ONLY): 31 min   Charges:   PT Evaluation $PT Eval Low Complexity: 1 Low PT Treatments $Gait Training: 8-22 mins   PT G Codes:        [place], PT, DPT [phone]   Alessandra Bevels Nieves Barberi 01/04/2018, 3:50 PM

## 2018-01-04 NOTE — Anesthesia Postprocedure Evaluation (Signed)
Anesthesia Post Note  Patient: Connor MannsJames A Minella Jr.  Procedure(s) Performed: MICRODISCECTOMY BILATERAL LUMBAR FOUR- LUMBAR FIVE, LEFT LUMBAR FIVE-SACRAL ONE (Bilateral Spine Lumbar)     Patient location during evaluation: PACU Anesthesia Type: General Level of consciousness: awake and alert, oriented and patient cooperative Pain management: pain level controlled Vital Signs Assessment: post-procedure vital signs reviewed and stable Respiratory status: spontaneous breathing, nonlabored ventilation, respiratory function stable and patient connected to nasal cannula oxygen Cardiovascular status: blood pressure returned to baseline and stable Postop Assessment: no apparent nausea or vomiting Anesthetic complications: no    Last Vitals:  Vitals:   01/04/18 1045 01/04/18 1115  BP: 126/72 (!) 157/94  Pulse: 77 77  Resp: 20 20  Temp: 36.6 C 36.5 C  SpO2: 92% 96%    Last Pain:  Vitals:   01/04/18 1110  TempSrc:   PainSc: 2                  Kaysan Peixoto,E. Aireal Slater

## 2018-01-04 NOTE — Transfer of Care (Signed)
Immediate Anesthesia Transfer of Care Note  Patient: Connor MannsJames A Bergeson Jr.  Procedure(s) Performed: MICRODISCECTOMY BILATERAL LUMBAR FOUR- LUMBAR FIVE, LEFT LUMBAR FIVE-SACRAL ONE (Bilateral Spine Lumbar)  Patient Location: PACU  Anesthesia Type:General  Level of Consciousness: awake, alert  and oriented  Airway & Oxygen Therapy: Patient Spontanous Breathing and Patient connected to nasal cannula oxygen  Post-op Assessment: Report given to RN and Post -op Vital signs reviewed and stable  Post vital signs: Reviewed and stable  Last Vitals:  Vitals Value Taken Time  BP    Temp    Pulse 88 01/04/2018 10:02 AM  Resp 18 01/04/2018 10:02 AM  SpO2 96 % 01/04/2018 10:02 AM  Vitals shown include unvalidated device data.  Last Pain:  Vitals:   01/04/18 0618  TempSrc: Oral  PainSc:       Patients Stated Pain Goal: 3 (01/04/18 0559)  Complications: No apparent anesthesia complications

## 2018-01-04 NOTE — Progress Notes (Signed)
Pharmacy Antibiotic Note  Connor MannsJames A Ohlrich Jr. is a 47 y.o. male admitted on 01/04/2018 with surgical prophylaxis.  Pharmacy has been consulted for vancomycin dosing.  Patient received vancomycin 1500mg  IV x1 pre-op at 0610 this morning. He does not have a drain in place.  Plan: Vancomycin 1500mg  IV x1 to be given at 1730 tonight Pharmacy to sign off as no future doses anticipated. Please re-consult if needed     Temp (24hrs), Avg:98.3 F (36.8 C), Min:97.9 F (36.6 C), Max:98.6 F (37 C)  Recent Labs  Lab 12/28/17 1506  WBC 10.7*  CREATININE 0.92    Estimated Creatinine Clearance: 122.9 mL/min (by C-G formula based on SCr of 0.92 mg/dL).    Allergies  Allergen Reactions  . Amoxicillin Rash    Has patient had a PCN reaction causing immediate rash, facial/tongue/throat swelling, SOB or lightheadedness with hypotension:  ++Yes++ Has patient had a PCN reaction causing severe rash involving mucus membranes or skin necrosis: Yes Has patient had a PCN reaction that required hospitalization: No Has patient had a PCN reaction occurring within the last 10 years: No If all of the above answers are "NO", then may proceed with Cephalosporin use.     Thank you for allowing pharmacy to be a part of this patient's care.  Connor Little 01/04/2018 11:14 AM

## 2018-01-04 NOTE — Progress Notes (Signed)
Pt and wife given D/C instructions with Rx's, verbal understanding was provided. Pt's incision is clean and dry with no sign of infection. Pt's IV was removed prior to D/C. Pt D/C'd home via wheelchair @ 1740 per MD order. Pt is stable @ D/C and has no other needs at this time. Rema FendtAshley Tylyn Stankovich, RN

## 2018-01-04 NOTE — Discharge Summary (Signed)
Physician Discharge Summary  Patient ID: Connor MannsJames A Jablon Jr. MRN: [id number] DOB/AGE: 1971-10-10 47 y.o.  Admit date: 01/04/2018 Discharge date: 01/04/2018  Admission Diagnoses:L4-5 herniated disc, spinal stenosis, left L5-S1 herniated disc, lumbago, lumbar radiculopathy   Discharge Diagnoses: L4-5 herniated disc, spinal stenosis, left L5-S1 herniated disc, lumbago, lumbar radiculopathy   Active Problems:   Lumbar herniated disc   Discharged Condition: good  Hospital Course: Connor Little was admitted and taken to the operating room for an uncomplicated lumbar discetomy. Post op he is ambulating, voiding, and tolerating a regular diet. He has normal strength in the lower extremities. His wound is clean, dry, and without signs of infecton.  Treatments: surgery: Bilateral L4-5 and left L5-S1 intervertebral discectomy using micro-dissection     Discharge Exam: Blood pressure (!) 157/94, pulse 77, temperature 97.7 F (36.5 C), resp. rate 20, SpO2 96 %. General appearance: alert, cooperative and no distress Neurologic: Grossly normal  Disposition: Discharge disposition: 01-Home or Self Care      DISC DISPLACEMENT, LUMBAR  Allergies as of 01/04/2018      Reactions   Amoxicillin Rash   Has patient had a PCN reaction causing immediate rash, facial/tongue/throat swelling, SOB or lightheadedness with hypotension:  ++Yes++ Has patient had a PCN reaction causing severe rash involving mucus membranes or skin necrosis: Yes Has patient had a PCN reaction that required hospitalization: No Has patient had a PCN reaction occurring within the last 10 years: No If all of the above answers are "NO", then may proceed with Cephalosporin use.      Medication List    STOP taking these medications   oxyCODONE-acetaminophen 5-325 MG tablet Commonly known as:  PERCOCET/ROXICET     TAKE these medications   FARXIGA 10 MG Tabs tablet Generic drug:  dapagliflozin propanediol Take 10 mg by mouth  daily with supper.   losartan 50 MG tablet Commonly known as:  COZAAR Take 50 mg by mouth every evening.   metFORMIN 750 MG 24 hr tablet Commonly known as:  GLUCOPHAGE-XR Take 750 mg by mouth daily with supper.   oxyCODONE 5 MG immediate release tablet Commonly known as:  ROXICODONE Take 1 tablet (5 mg total) by mouth every 6 (six) hours as needed for severe pain.   tiZANidine 4 MG tablet Commonly known as:  ZANAFLEX Take 1 tablet (4 mg total) by mouth every 6 (six) hours as needed for muscle spasms.   XARELTO 15 MG Tabs tablet Generic drug:  Rivaroxaban Take 15 mg by mouth daily.      Follow-up Information    Tressie StalkerJenkins, Jeffrey, MD Follow up in 3 week(s).   Specialty:  Neurosurgery Why:  please call the office to make an appointment Contact information: [address] [place] [address] [phone]           Signed: Carmela HurtCABBELL,Sofiya Ezelle L 01/04/2018, 4:57 PM

## 2018-01-04 NOTE — Anesthesia Procedure Notes (Signed)
Procedure Name: Intubation Date/Time: 01/04/2018 7:38 AM Performed by: Candis Shine, CRNA Pre-anesthesia Checklist: Patient identified, Emergency Drugs available, Suction available and Patient being monitored Patient Re-evaluated:Patient Re-evaluated prior to induction Oxygen Delivery Method: Circle System Utilized Preoxygenation: Pre-oxygenation with 100% oxygen Induction Type: IV induction Ventilation: Mask ventilation without difficulty, Oral airway inserted - appropriate to patient size and Two handed mask ventilation required Laryngoscope Size: Mac and 4 Grade View: Grade II Tube type: Oral Tube size: 7.5 mm Number of attempts: 1 Airway Equipment and Method: Stylet and Oral airway Placement Confirmation: ETT inserted through vocal cords under direct vision,  positive ETCO2 and breath sounds checked- equal and bilateral Secured at: 23 cm Tube secured with: Tape Dental Injury: Teeth and Oropharynx as per pre-operative assessment

## 2018-01-04 NOTE — Progress Notes (Signed)
Subjective: The patient is somnolent but easily arousable.  He is in no apparent distress.  He looks well.  Objective: Vital signs in last 24 hours: Temp:  [97.9 F (36.6 C)-98.6 F (37 C)] 97.9 F (36.6 C) (03/27 1000) Pulse Rate:  [83-86] 84 (03/27 1015) Resp:  [16-20] 19 (03/27 1015) BP: (116-152)/(59-97) 132/74 (03/27 1015) SpO2:  [93 %-98 %] 93 % (03/27 1015) Estimated body mass index is 43.9 kg/m as calculated from the following:   Height as of 12/28/17: 5\' 5"  (1.651 m).   Weight as of 12/28/17: 119.7 kg (263 lb 12.8 oz).   Intake/Output from previous day: No intake/output data recorded. Intake/Output this shift: Total I/O In: 700 [I.V.:700] Out: 25 [Blood:25]  Physical exam patient is somnolent but arousable.  He is moving his lower extremities well.  Lab Results: No results for input(s): WBC, HGB, HCT, PLT in the last 72 hours. BMET No results for input(s): NA, K, CL, CO2, GLUCOSE, BUN, CREATININE, CALCIUM in the last 72 hours.  Studies/Results: Dg Lumbar Spine 1 View  Result Date: 01/04/2018 CLINICAL DATA:  Lumbar spine surgery, L4-S1 laminectomy EXAM: LUMBAR SPINE - 1 VIEW COMPARISON:  Portable exam 0815 hours compared MRI lumbar spine of 11/10/2017 and earlier lumbar spine radiographs of 10/24/2017 FINDINGS: Prior MR does not have lumbar spine segments labeled. Current exam is labeled presuming 5 lumbar vertebra with last open interspace labeled as L5-S1. Recommend this numbering system be correlated with that utilized on the prior MR. Although the lumbar spine segments are not labeled on the earlier radiographs, the report describes minimal L4-L5 disc space narrowing, which would be consistent with numbering of 5 lumbar vertebra. Metallic probe via dorsal approach projects dorsal to the L3-L4 disc space. IMPRESSION: Dorsal localization of the L3-L4 disc space level, assuming presence of 5 lumbar vertebra as described above. Electronically Signed   By: Ulyses SouthwardMark  Boles M.D.    On: 01/04/2018 10:04    Assessment/Plan: The patient is doing well.  I spoke with his wife.  LOS: 0 days     Cristi LoronJeffrey D Alesia Oshields 01/04/2018, 10:20 AM

## 2018-01-04 NOTE — H&P (Signed)
Subjective: The patient is a 47 year old white male who has complained of back and left greater right leg pain consistent with a lumbar radiculopathy.  He has failed medical management and was worked up with a lumbar MRI.  This demonstrated a herniated disks at L4-5 centrally with severe stenosis and a left L5-S1 herniated disc.  I discussed the various treatment options with the patient.  He has decided to proceed with surgery.  Past Medical History:  Diagnosis Date  . Diabetes mellitus without complication (HCC)    Type II  . Disc displacement, lumbar   . DVT (deep venous thrombosis) (HCC)   . Factor 5 Leiden mutation, heterozygous (HCC) 02/17/2015  . Hepatic steatosis 02/17/2015  . HTN (hypertension), benign 02/17/2015  . Hx of pulmonary embolus 02/17/2015   Bilateral 06/29/13  . Hypertension   . Left leg DVT (HCC) 02/17/2015   Concomitant bilateral PE 06/29/13  . OSA on CPAP 02/17/2015   Dx 2014  . Pulmonary embolism (HCC)     History reviewed. No pertinent surgical history.  Allergies  Allergen Reactions  . Amoxicillin Rash    Has patient had a PCN reaction causing immediate rash, facial/tongue/throat swelling, SOB or lightheadedness with hypotension:  ++Yes++ Has patient had a PCN reaction causing severe rash involving mucus membranes or skin necrosis: Yes Has patient had a PCN reaction that required hospitalization: No Has patient had a PCN reaction occurring within the last 10 years: No If all of the above answers are "NO", then may proceed with Cephalosporin use.     Social History   Tobacco Use  . Smoking status: Never Smoker  . Smokeless tobacco: Never Used  Substance Use Topics  . Alcohol use: Yes    Alcohol/week: 0.0 oz    Comment: Rarely.    Family History  Problem Relation Age of Onset  . Diabetes Mellitus II Father   . CAD Father    Prior to Admission medications   Medication Sig Start Date End Date Taking? Authorizing Provider  FARXIGA 10 MG TABS tablet Take 10 mg  by mouth daily with supper. 10/26/17  Yes [provider]  losartan (COZAAR) 50 MG tablet Take 50 mg by mouth every evening. 10/04/17  Yes [provider]  metFORMIN (GLUCOPHAGE-XR) 750 MG 24 hr tablet Take 750 mg by mouth daily with supper. 09/30/17  Yes [provider]  XARELTO 15 MG TABS tablet Take 15 mg by mouth daily. 11/07/17  Yes [provider]  oxyCODONE-acetaminophen (PERCOCET/ROXICET) 5-325 MG tablet Take 1 tablet by mouth every 6 (six) hours as needed for severe pain. Patient not taking: Reported on 12/21/2017 10/26/17   Eyvonne MechanicHedges, Quintin Hjort, PA-C     Review of Systems  Positive ROS: As above  All other systems have been reviewed and were otherwise negative with the exception of those mentioned in the HPI and as above.  Objective: Vital signs in last 24 hours: Temp:  [98.6 F (37 C)] 98.6 F (37 C) (03/27 0618) Pulse Rate:  [86] 86 (03/27 0618) Resp:  [20] 20 (03/27 0618) BP: (152)/(97) 152/97 (03/27 0618) SpO2:  [98 %] 98 % (03/27 0618) Estimated body mass index is 43.9 kg/m as calculated from the following:   Height as of 12/28/17: 5\' 5"  (1.651 m).   Weight as of 12/28/17: 119.7 kg (263 lb 12.8 oz).   General Appearance: Alert Head: Normocephalic, without obvious abnormality, atraumatic Eyes: PERRL, conjunctiva/corneas clear, EOM's intact,    Ears: Normal  Throat: Normal  Neck: Supple,  Back: unremarkable Lungs: Clear to auscultation bilaterally, respirations unlabored Heart: Regular rate and rhythm, no murmur, rub or gallop Abdomen: Soft, non-tender Extremities: Extremities normal, atraumatic, no cyanosis or edema Skin: unremarkable  NEUROLOGIC:   Mental status: alert and oriented,Motor Exam - grossly normal except he has weakness in his left EHL and dorsiflexors sensory Exam - grossly normal Reflexes:  Coordination - grossly normal Gait - grossly normal Balance - grossly normal Cranial Nerves: I: smell Not tested  II: visual  acuity  OS: Normal  OD: Normal   II: visual fields Full to confrontation  II: pupils Equal, round, reactive to light  III,VII: ptosis None  III,IV,VI: extraocular muscles  Full ROM  V: mastication Normal  V: facial light touch sensation  Normal  V,VII: corneal reflex  Present  VII: facial muscle function - upper  Normal  VII: facial muscle function - lower Normal  VIII: hearing Not tested  IX: soft palate elevation  Normal  IX,X: gag reflex Present  XI: trapezius strength  5/5  XI: sternocleidomastoid strength 5/5  XI: neck flexion strength  5/5  XII: tongue strength  Normal    Data Review Lab Results  Component Value Date   WBC 10.7 (H) 12/28/2017   HGB 16.0 12/28/2017   HCT 48.4 12/28/2017   MCV 94.3 12/28/2017   PLT 286 12/28/2017   Lab Results  Component Value Date   NA 138 12/28/2017   K 4.2 12/28/2017   CL 103 12/28/2017   CO2 26 12/28/2017   BUN 9 12/28/2017   CREATININE 0.92 12/28/2017   GLUCOSE 144 (H) 12/28/2017   Lab Results  Component Value Date   INR 0.94 01/04/2018    Assessment/Plan: L4-5 herniated disc, spinal stenosis, left L5-S1 herniated disc, lumbago, lumbar radiculopathy: I have discussed the situation with the patient and his wife.  I have reviewed the imaging studies with him and pointed out the abnormalities.  We have discussed the various treatment options including surgery.  I have described the surgical treatment option of a bilateral lateral L4-5 discectomy with a left L5-S1 discectomy.  I have shown him surgical models.  I have given them a surgical pamphlet.  We have discussed the risks, benefits, alternatives, expected postoperative course, and likelihood of achieving our goals with surgery.  I have answered all their questions.  He has decided to proceed with surgery.   Cristi Loron 01/04/2018 7:18 AM

## 2018-01-04 NOTE — Op Note (Signed)
Brief history: The patient is a 47 year old white male who has complained of back and left greater right leg pain with left foot drop.  He has failed medical management and was worked up with a lumbar MRI.  This demonstrated a herniated disc and spinal stenosis at L4-5 and L5-S1.  I discussed the various treatment option with the patient and his wife.  He has weighed the risks, benefits, and alternatives of surgery and decided proceed with a bilateral L4-5 and left L5-S1 discectomy.  Preoperative diagnosis: L4-5 herniated disc, spinal stenosis, left L5-S1 herniated disc, lumbago, lumbar radiculopathy  Postoperative diagnosis: The same  Procedure: Bilateral L4-5 and left L5-S1 intervertebral discectomy using micro-dissection  Surgeon: Dr. Delma Officer  Asst.: None  Anesthesia: Gen. endotracheal  Estimated blood loss: 100 cc  Drains: None  Complications: None  Description of procedure: The patient was brought to the operating room by the anesthesia team. General endotracheal anesthesia was induced. The patient was turned to the prone position on the Wilson frame. The patient's lumbosacral region was then prepared with Betadine scrub and Betadine solution. Sterile drapes were applied.  I then injected the area to be incised with Marcaine with epinephrine solution. I then used a scalpel to make a linear midline incision over the L4-5 and L5-S1 intervertebral disc space. I then used electrocautery to perform a bilateral  subperiosteal dissection exposing the spinous process and lamina of L4, L5 and the upper sacrum. We obtained intraoperative radiograph to confirm our location. I then inserted the [facility] retractor for exposure.  We then brought the operative microscope into the field. Under its magnification and illumination we completed the microdissection. I used a high-speed drill to perform a laminotomy at L4-5 bilaterally and at L5-S1 on the left. I then used a Kerrison punches to widen  the laminotomy and removed the ligamentum flavum at L4-5 bilaterally and L5-S1 on the left. We then used microdissection to free up the thecal sac and the bilateral L5 nerve root from the epidural tissue. I then used a Kerrison punch to perform a foraminotomy at about the bilateral L5 nerve root. We then using the nerve root retractor to gently retract the thecal sac at L5 nerve root medially. This exposed the intervertebral disc. We identified the ruptured disc and remove it with the pituitary forceps.  I inspected the intervertebral disc.  There was a hole in the annulus but no impending herniations.  I did not perform an intervertebral discectomy.  We then repeated this procedure in an analogous fashion at L5-S1 the left.  We retracted the left S1 nerve root medially.  This exposed the herniated disc.  We removed it in multiple fragments with the pituitary forceps.  Again we did not enter into an intervertebral disc space.  I then palpated along the ventral surface of the thecal sac and along exit route of the bilateral L5 and left S1 nerve root and noted that the neural structures were well decompressed. This completed the decompression.  We then obtained hemostasis using bipolar electrocautery. We irrigated the wound out with bacitracin solution. We then removed the retractor. We then reapproximated the patient's thoracolumbar fascia with interrupted #1 Vicryl suture. We then reapproximated the patient's subcutaneous tissue with interrupted 2-0 Vicryl suture. We then reapproximated patient's skin with Steri-Strips and benzoin. The was then coated with bacitracin ointment. The drapes were removed. The patient was subsequently returned to the supine position where they were extubated by the anesthesia team. The patient was then transported to  the postanesthesia care unit in stable condition. All sponge instrument and needle counts were reportedly correct at the end of this case.

## 2018-01-05 ENCOUNTER — Encounter (HOSPITAL_COMMUNITY): Payer: Self-pay | Admitting: Neurosurgery

## 2018-11-12 ENCOUNTER — Encounter (HOSPITAL_COMMUNITY): Payer: Self-pay | Admitting: *Deleted

## 2018-11-12 ENCOUNTER — Emergency Department (HOSPITAL_COMMUNITY): Payer: BC Managed Care – PPO

## 2018-11-12 ENCOUNTER — Emergency Department (HOSPITAL_COMMUNITY)
Admission: EM | Admit: 2018-11-12 | Discharge: 2018-11-12 | Disposition: A | Discharge status: Home / Self Care | Payer: BC Managed Care – PPO | Attending: Emergency Medicine | Admitting: Emergency Medicine

## 2018-11-12 ENCOUNTER — Other Ambulatory Visit: Payer: Self-pay

## 2018-11-12 DIAGNOSIS — K5792 Diverticulitis of intestine, part unspecified, without perforation or abscess without bleeding: Secondary | ICD-10-CM | POA: Diagnosis not present

## 2018-11-12 DIAGNOSIS — E119 Type 2 diabetes mellitus without complications: Secondary | ICD-10-CM | POA: Diagnosis not present

## 2018-11-12 DIAGNOSIS — I10 Essential (primary) hypertension: Secondary | ICD-10-CM | POA: Insufficient documentation

## 2018-11-12 DIAGNOSIS — R1030 Lower abdominal pain, unspecified: Secondary | ICD-10-CM | POA: Diagnosis present

## 2018-11-12 LAB — COMPREHENSIVE METABOLIC PANEL
ALT: 39 U/L (ref 0–44)
AST: 22 U/L (ref 15–41)
Albumin: 4.3 g/dL (ref 3.5–5.0)
Alkaline Phosphatase: 44 U/L (ref 38–126)
Anion gap: 10 (ref 5–15)
BUN: 13 mg/dL (ref 6–20)
CO2: 24 mmol/L (ref 22–32)
Calcium: 9.1 mg/dL (ref 8.9–10.3)
Chloride: 104 mmol/L (ref 98–111)
Creatinine, Ser: 0.86 mg/dL (ref 0.61–1.24)
Glucose, Bld: 104 mg/dL — ABNORMAL HIGH (ref 70–99)
POTASSIUM: 3.5 mmol/L (ref 3.5–5.1)
Sodium: 138 mmol/L (ref 135–145)
TOTAL PROTEIN: 7.8 g/dL (ref 6.5–8.1)
Total Bilirubin: 0.6 mg/dL (ref 0.3–1.2)

## 2018-11-12 LAB — CBC
HCT: 47.7 % (ref 39.0–52.0)
HEMOGLOBIN: 15.5 g/dL (ref 13.0–17.0)
MCH: 30.9 pg (ref 26.0–34.0)
MCHC: 32.5 g/dL (ref 30.0–36.0)
MCV: 95.2 fL (ref 80.0–100.0)
Platelets: 313 10*3/uL (ref 150–400)
RBC: 5.01 MIL/uL (ref 4.22–5.81)
RDW: 12.4 % (ref 11.5–15.5)
WBC: 16 10*3/uL — ABNORMAL HIGH (ref 4.0–10.5)
nRBC: 0 % (ref 0.0–0.2)

## 2018-11-12 LAB — URINALYSIS, ROUTINE W REFLEX MICROSCOPIC
Bacteria, UA: NONE SEEN
Bilirubin Urine: NEGATIVE
Glucose, UA: >=500 mg/dL — AB
Ketones, ur: 5 mg/dL — AB
Leukocytes, UA: NEGATIVE
Nitrite: NEGATIVE
Protein, ur: NEGATIVE mg/dL
Specific Gravity, Urine: 1.029 (ref 1.005–1.030)
pH: 5 (ref 5.0–8.0)

## 2018-11-12 LAB — LIPASE, BLOOD: LIPASE: 29 U/L (ref 11–51)

## 2018-11-12 MED ORDER — METRONIDAZOLE 500 MG PO TABS
500.0000 mg | ORAL_TABLET | Freq: Once | ORAL | Status: AC
  Administered 2018-11-12: 500 mg via ORAL
  Filled 2018-11-12: qty 1

## 2018-11-12 MED ORDER — OXYCODONE HCL 5 MG PO TABS: 5.0000 mg | ORAL_TABLET | Freq: Four times a day (QID) | ORAL | 0 refills | Status: AC | PRN

## 2018-11-12 MED ORDER — CIPROFLOXACIN HCL 500 MG PO TABS: 500.0000 mg | ORAL_TABLET | Freq: Two times a day (BID) | ORAL | 0 refills | Status: AC

## 2018-11-12 MED ORDER — CIPROFLOXACIN HCL 500 MG PO TABS
500.0000 mg | ORAL_TABLET | Freq: Once | ORAL | Status: AC
  Administered 2018-11-12: 500 mg via ORAL
  Filled 2018-11-12: qty 1

## 2018-11-12 MED ORDER — LACTATED RINGERS IV BOLUS
1000.0000 mL | Freq: Once | INTRAVENOUS | Status: AC
  Administered 2018-11-12: 1000 mL via INTRAVENOUS

## 2018-11-12 MED ORDER — SODIUM CHLORIDE 0.9% FLUSH
3.0000 mL | Freq: Once | INTRAVENOUS | Status: AC
  Administered 2018-11-12: 3 mL via INTRAVENOUS

## 2018-11-12 MED ORDER — METRONIDAZOLE 500 MG PO TABS: 500.0000 mg | ORAL_TABLET | Freq: Two times a day (BID) | ORAL | 0 refills | Status: AC

## 2018-11-12 MED ORDER — DOCUSATE SODIUM 100 MG PO CAPS: 100.0000 mg | ORAL_CAPSULE | Freq: Two times a day (BID) | ORAL | 0 refills | Status: AC

## 2018-11-12 MED ORDER — IOPAMIDOL (ISOVUE-300) INJECTION 61%
100.0000 mL | Freq: Once | INTRAVENOUS | Status: AC | PRN
  Administered 2018-11-12: 100 mL via INTRAVENOUS

## 2018-11-12 MED ORDER — FENTANYL CITRATE (PF) 100 MCG/2ML IJ SOLN
50.0000 ug | Freq: Once | INTRAMUSCULAR | Status: DC
  Filled 2018-11-12: qty 2

## 2018-11-12 NOTE — ED Notes (Signed)
Patient transported to CT 

## 2018-11-12 NOTE — ED Provider Notes (Signed)
Emergency Department Provider Note   I have reviewed the triage vital signs and the nursing notes.   HISTORY  Chief Complaint Abdominal Pain   HPI Connor MannsJames A Cheetham Jr. is a 48 y.o. male here with abdominal pain.  Patient started having hernia about 6 to 8 months ago has been progressively worsening but the last 1 or 2 days he has had worsening pain around the hernia site but seems to be a little bit lower as well. Some decreased BM. No other associated symptoms.  No other associated or modifying symptoms.    Past Medical History:  Diagnosis Date  . Diabetes mellitus without complication (HCC)    Type II  . Disc displacement, lumbar   . DVT (deep venous thrombosis) (HCC)   . Factor 5 Leiden mutation, heterozygous (HCC) 02/17/2015  . Hepatic steatosis 02/17/2015  . HTN (hypertension), benign 02/17/2015  . Hx of pulmonary embolus 02/17/2015   Bilateral 06/29/13  . Hypertension   . Left leg DVT (HCC) 02/17/2015   Concomitant bilateral PE 06/29/13  . OSA on CPAP 02/17/2015   Dx 2014  . Pulmonary embolism [facility](HCC)     Patient Active Problem List   Diagnosis Date Noted  . Lumbar herniated disc 01/04/2018  . Hx of pulmonary embolus 02/17/2015  . Left leg DVT (HCC) 02/17/2015  . Factor 5 Leiden mutation, heterozygous (HCC) 02/17/2015  . HTN (hypertension), benign 02/17/2015  . OSA on CPAP 02/17/2015  . Hepatic steatosis 02/17/2015    Past Surgical History:  Procedure Laterality Date  . LUMBAR LAMINECTOMY/DECOMPRESSION MICRODISCECTOMY Bilateral 01/04/2018   Procedure: MICRODISCECTOMY BILATERAL LUMBAR FOUR- LUMBAR FIVE, LEFT LUMBAR FIVE-SACRAL ONE;  Surgeon: Tressie StalkerJenkins, Jeffrey, MD;  [facility] OR;  Service: Neurosurgery;  Laterality: Bilateral;    Current Outpatient Rx  . Order #: [id number] Class: Historical Med  . Order #: [id number] Class: Historical Med  . Order #: [id number] Class: Historical Med  . Order #: [id number] Class: Historical Med  . Order #: [id number] Class: Historical Med  .  Order #: [id number] Class: Print  . Order #: [id number] Class: Normal  . Order #: [id number] Class: Print  . Order #: [id number] Class: Print    Allergies Amoxicillin  Family History  Problem Relation Age of Onset  . Diabetes Mellitus II Father   . CAD Father     Social History Social History   Tobacco Use  . Smoking status: Never Smoker  . Smokeless tobacco: Never Used  Substance Use Topics  . Alcohol use: Yes    Alcohol/week: 0.0 standard drinks    Comment: Rarely.  . Drug use: No    Review of Systems  All other systems negative except as documented in the HPI. All pertinent positives and negatives as reviewed in the HPI. ____________________________________________   PHYSICAL EXAM:  VITAL SIGNS: ED Triage Vitals  Enc Vitals Group     BP 11/12/18 1740 (!) 158/100     Pulse Rate 11/12/18 1740 (!) 106     Resp 11/12/18 1740 17     Temp 11/12/18 1740 97.9 F (36.6 C)     Temp Source 11/12/18 1740 Oral     SpO2 11/12/18 1918 98 %     Weight 11/12/18 1741 275 lb (124.7 kg)     Height 11/12/18 1741 5\' 9"  (1.753 m)    Constitutional: Alert and oriented. Well appearing and in no acute distress. Eyes: Conjunctivae are normal. PERRL. EOMI. Head: Atraumatic. Nose: No congestion/rhinnorhea. Mouth/Throat: Mucous membranes are moist.  Oropharynx non-erythematous. Neck: No stridor.  No meningeal signs.  Cardiovascular: Normal rate, regular rhythm. Good peripheral circulation. Grossly normal heart sounds.   Respiratory: Normal respiratory effort.  No retractions. Lungs CTAB. Gastrointestinal: Soft and nontender. No distention. Easily reducible hernia without ttp or erythema. Musculoskeletal: No lower extremity tenderness nor edema. No gross deformities of extremities. Neurologic:  Normal speech and language. No gross focal neurologic deficits are appreciated.  Skin:  Skin is warm, dry and intact. No rash noted.   ____________________________________________   LABS (all  labs ordered are listed, but only abnormal results are displayed)  Labs Reviewed  COMPREHENSIVE METABOLIC PANEL - Abnormal; Notable for the following components:      Result Value   Glucose, Bld 104 (*)    All other components within normal limits  CBC - Abnormal; Notable for the following components:   WBC 16.0 (*)    All other components within normal limits  URINALYSIS, ROUTINE W REFLEX MICROSCOPIC - Abnormal; Notable for the following components:   Glucose, UA >=500 (*)    Hgb urine dipstick SMALL (*)    Ketones, ur 5 (*)    All other components within normal limits  LIPASE, BLOOD   ____________________________________________  RADIOLOGY  Ct Abdomen Pelvis W Contrast  Result Date: 11/12/2018 CLINICAL DATA:  Periumbilical pain, nausea, and vomiting since this afternoon. EXAM: CT ABDOMEN AND PELVIS WITH CONTRAST TECHNIQUE: Multidetector CT imaging of the abdomen and pelvis was performed using the standard protocol following bolus administration of intravenous contrast. CONTRAST:  ISOVUE-300 IOPAMIDOL (ISOVUE-300) INJECTION 61% COMPARISON:  06/23/2014 FINDINGS: Lower chest: Lung bases are clear. Hepatobiliary: Diffuse fatty infiltration of the liver. No focal liver lesions. Gallbladder and bile ducts are unremarkable. Pancreas: Unremarkable. No pancreatic ductal dilatation or surrounding inflammatory changes. Spleen: Normal in size without focal abnormality. Adrenals/Urinary Tract: Adrenal glands are unremarkable. Kidneys are normal, without renal calculi, focal lesion, or hydronephrosis. Bladder is unremarkable. Stomach/Bowel: Stomach, small bowel, and colon are not abnormally distended. In the mid sigmoid colon, there is a focal area of circumferential wall thickening associated with a prominent diverticulum and with prominent stranding in the adjacent fat. Appearance is likely to represent acute diverticulitis. No abscess is demonstrated. Given the focal in circumferential appearance  of this lesion, underlying colon cancer should also be excluded and consider follow-up or colonoscopy after resolution of acute process. Appendix is normal. Vascular/Lymphatic: No significant vascular findings are present. No enlarged abdominal or pelvic lymph nodes. Reproductive: Prostate gland is enlarged, measuring 5.2 cm diameter. Other: No free air or free fluid in the abdomen. Small periumbilical hernia containing fat. Musculoskeletal: No destructive bone lesions appreciated. IMPRESSION: 1. Changes of acute diverticulitis in the mid sigmoid region. No abscess. Underlying colon cancer should be excluded after resolution of acute process. 2. Diffuse fatty infiltration of the liver. 3. Enlarged prostate gland. 4. Small periumbilical hernia containing fat. Electronically Signed   By: Burman Nieves M.D.   On: 11/12/2018 21:51    ____________________________________________   INITIAL IMPRESSION / ASSESSMENT AND PLAN / ED COURSE  Diverticulitis without complication. Allergy to amoxicillin. Will treat w/ cipro/flagyl.      Pertinent labs & imaging results that were available during my care of the patient were reviewed by me and considered in my medical decision making (see chart for details).  ____________________________________________  FINAL CLINICAL IMPRESSION(S) / ED DIAGNOSES  Final diagnoses:  Diverticulitis     MEDICATIONS GIVEN DURING THIS VISIT:  Medications  fentaNYL (SUBLIMAZE) injection 50 mcg (0 mcg Intravenous Hold 11/12/18 2031)  sodium chloride flush (NS)  0.9 % injection 3 mL (3 mLs Intravenous Given 11/12/18 1926)  lactated ringers bolus 1,000 mL (0 mLs Intravenous Stopped 11/12/18 2227)  iopamidol (ISOVUE-300) 61 % injection 100 mL (100 mLs Intravenous Contrast Given 11/12/18 2125)  ciprofloxacin (CIPRO) tablet 500 mg (500 mg Oral Given 11/12/18 2321)  metroNIDAZOLE (FLAGYL) tablet 500 mg (500 mg Oral Given 11/12/18 2321)     NEW OUTPATIENT MEDICATIONS STARTED DURING THIS  VISIT:  Discharge Medication List as of 11/12/2018 11:08 PM    START taking these medications   Details  ciprofloxacin (CIPRO) 500 MG tablet Take 1 tablet (500 mg total) by mouth 2 (two) times daily. One po bid x 7 days, Starting Sun 11/12/2018, Print    docusate sodium (COLACE) 100 MG capsule Take 1 capsule (100 mg total) by mouth every 12 (twelve) hours., Starting Sun 11/12/2018, Normal    metroNIDAZOLE (FLAGYL) 500 MG tablet Take 1 tablet (500 mg total) by mouth 2 (two) times daily. One po bid x 7 days, Starting Sun 11/12/2018, Print        Note:  This note was prepared with assistance of Dragon voice recognition software. Occasional wrong-word or sound-a-like substitutions may have occurred due to the inherent limitations of voice recognition software.   Cristan Hout, Barbara Cower, MD 11/13/18 [phone]

## 2018-11-12 NOTE — ED Triage Notes (Signed)
Pt presents with umbilical pain with tenderness and protrusion. Pin in lower back and abd pain

## 2018-11-12 NOTE — ED Notes (Signed)
Bed: WA05 Expected date:  Expected time:  Means of arrival:  Comments: 

## 2019-09-04 ENCOUNTER — Other Ambulatory Visit: Payer: Self-pay

## 2019-09-04 DIAGNOSIS — Z20822 Contact with and (suspected) exposure to covid-19: Secondary | ICD-10-CM

## 2019-09-06 LAB — NOVEL CORONAVIRUS, NAA: SARS-CoV-2, NAA: NOT DETECTED

## 2020-02-29 IMAGING — CT CT ABD-PELV W/ CM
2 of 5 series · 16 of 46 positions shown, 18 images · IV contrast (ISOVUE)
Comparison: 06/23/2014

CLINICAL DATA: Periumbilical pain, nausea, and vomiting since this
afternoon.

EXAM:
CT ABDOMEN AND PELVIS WITH CONTRAST
TECHNIQUE: Multidetector CT imaging of the abdomen and pelvis was performed
using the standard protocol following bolus administration of
intravenous contrast.
CONTRAST:  100mL 91MFY0-RLL IOPAMIDOL (91MFY0-RLL) INJECTION 61%

[Series 2: axial st · axial · 0.88mm/px · z∈[-475,-50]mm · 13 of 99 slices shown, 15 images]
[im 7/99  soft-tissue]
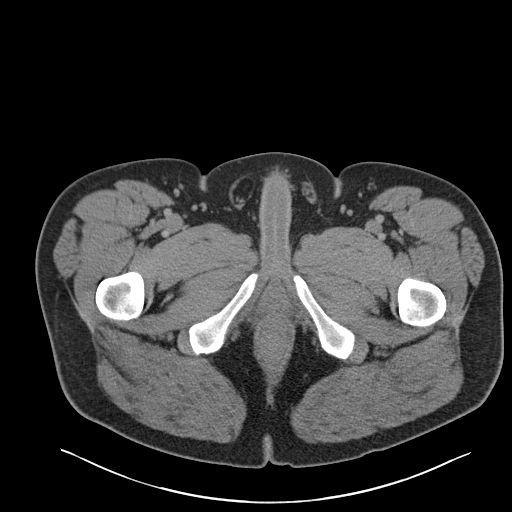
[im 7/99  bone]
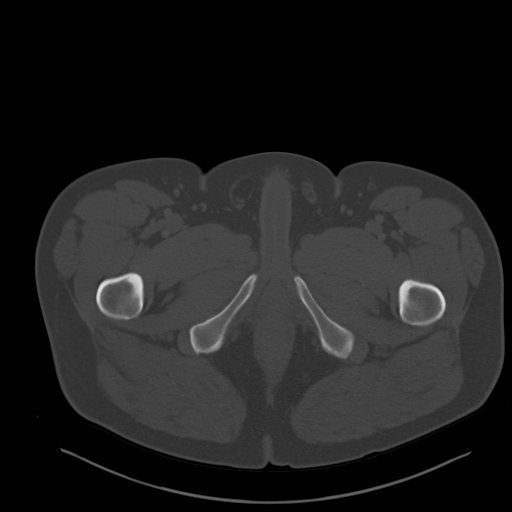
[im 14/99  soft-tissue]
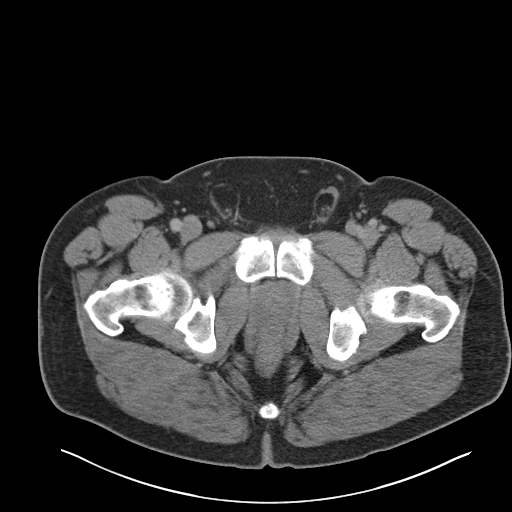
[im 20/99  soft-tissue]
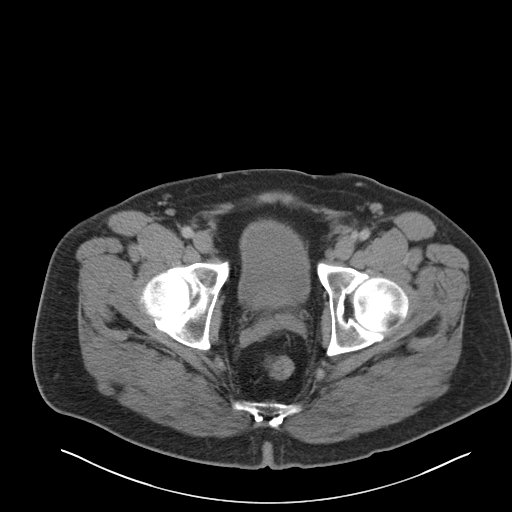
[im 27/99  soft-tissue]
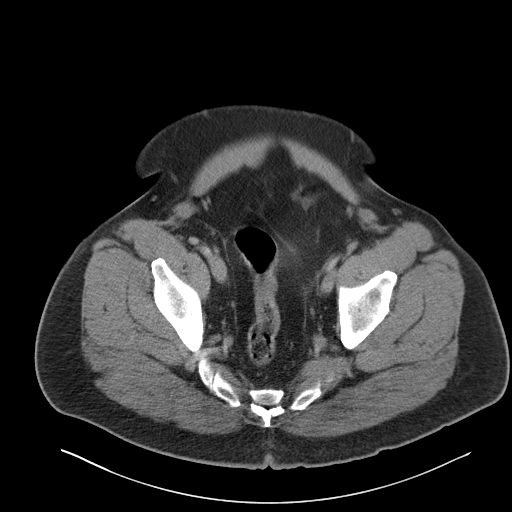
[im 33/99  soft-tissue]
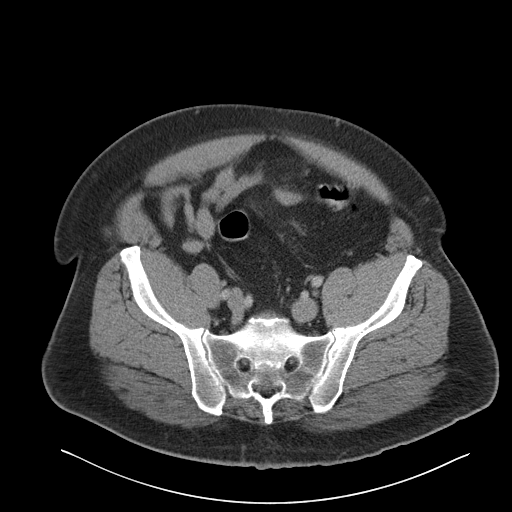
[im 40/99  soft-tissue]
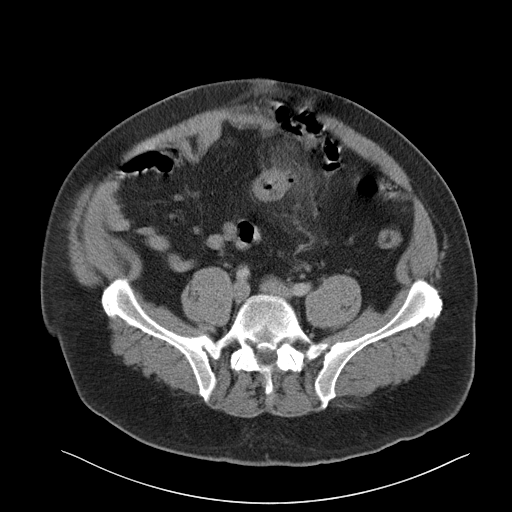
[im 53/99  soft-tissue]
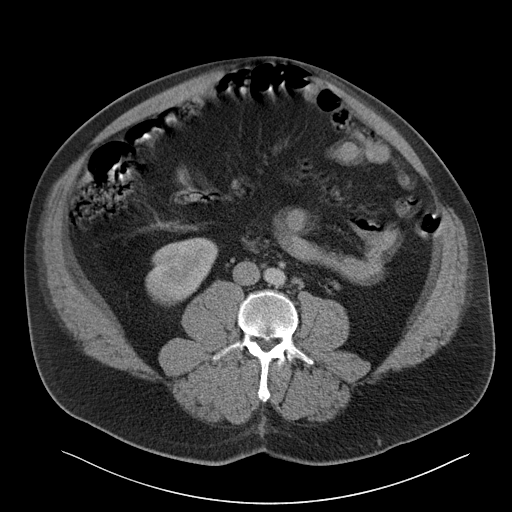
[im 59/99  soft-tissue]
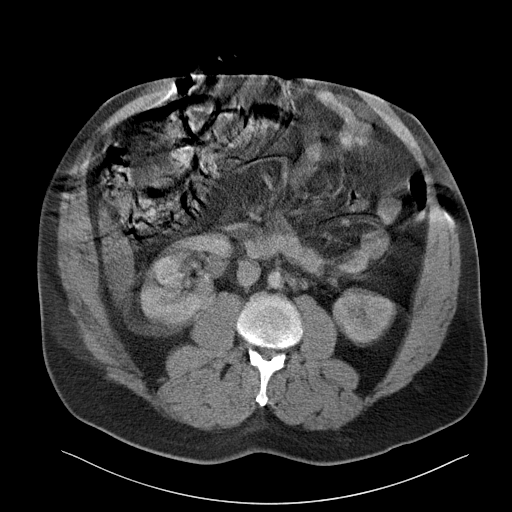
[im 66/99  soft-tissue]
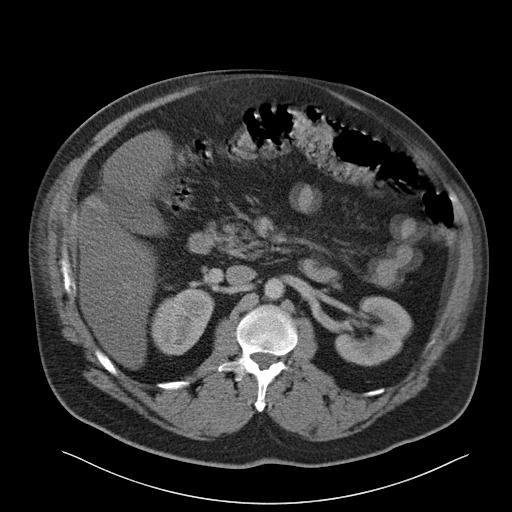
[im 66/99  bone]
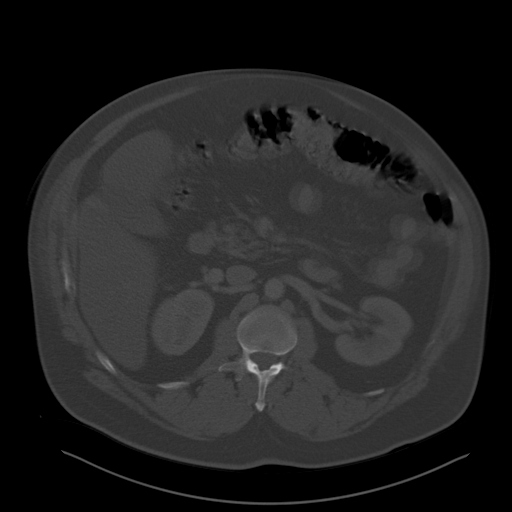
[im 72/99  soft-tissue]
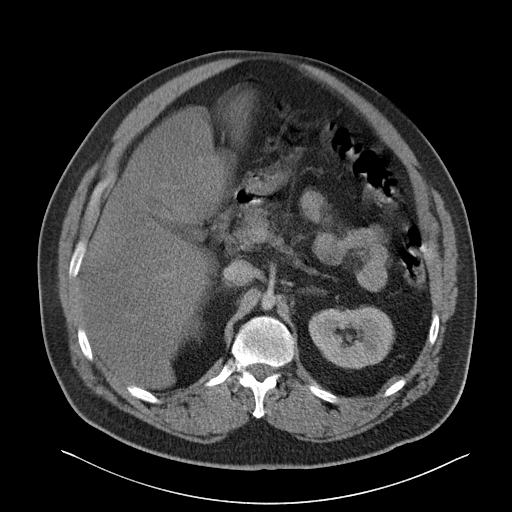
[im 79/99  soft-tissue]
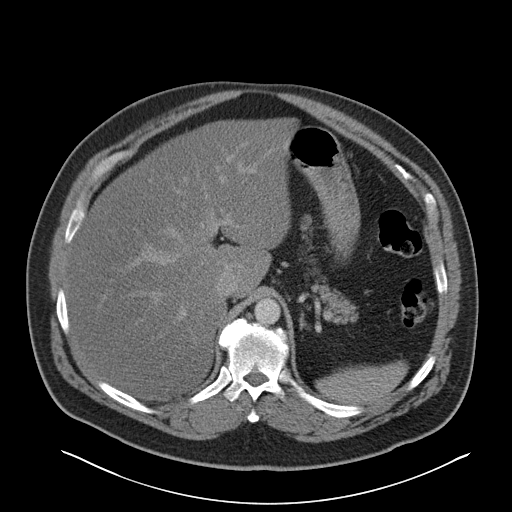
[im 85/99  soft-tissue]
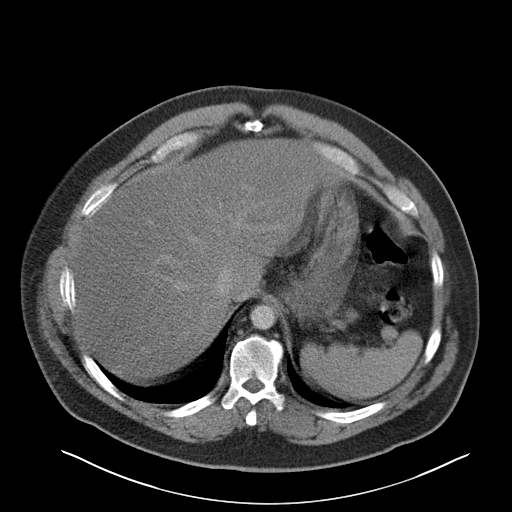
[im 92/99  soft-tissue]
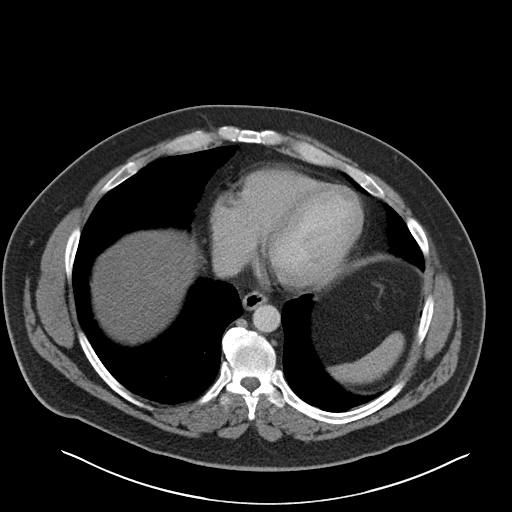

[Series 6: coronal st · coronal · 0.84mm/px · 3 of 125 slices shown]
[im 42/125  soft-tissue]
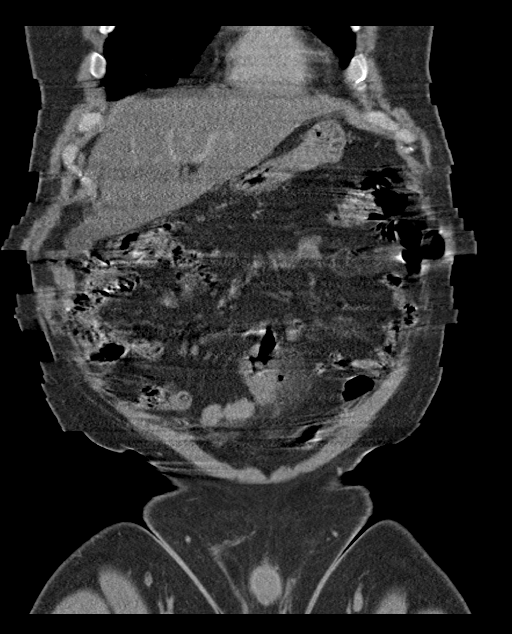
[im 56/125  soft-tissue]
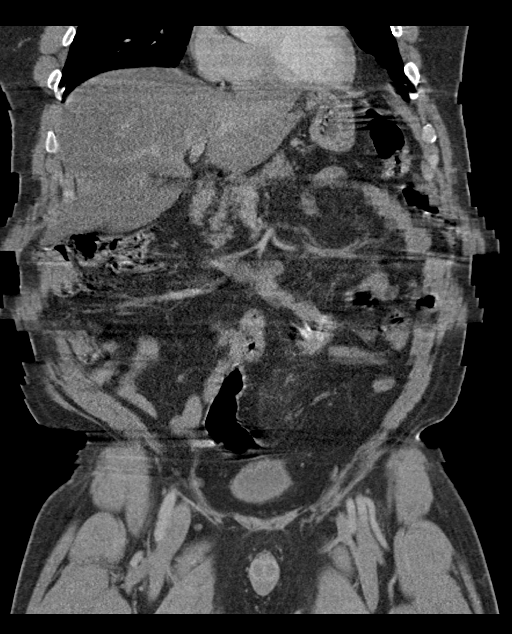
[im 69/125  soft-tissue]
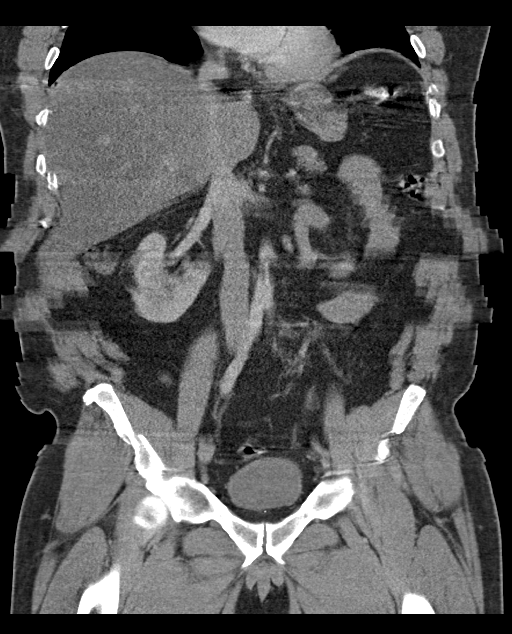

[16 of 46 positions shown; findings below may reference images not displayed]

FINDINGS: Lower chest: Lung bases are clear.

Hepatobiliary: Diffuse fatty infiltration of the liver. No focal
liver lesions. Gallbladder and bile ducts are unremarkable.

Pancreas: Unremarkable. No pancreatic ductal dilatation or
surrounding inflammatory changes.

Spleen: Normal in size without focal abnormality.

Adrenals/Urinary Tract: Adrenal glands are unremarkable. Kidneys are
normal, without renal calculi, focal lesion, or hydronephrosis.
Bladder is unremarkable.

Stomach/Bowel: Stomach, small bowel, and colon are not abnormally
distended. In the mid sigmoid colon, there is a focal area of
circumferential wall thickening associated with a prominent
diverticulum and with prominent stranding in the adjacent fat.
Appearance is likely to represent acute diverticulitis. No abscess
is demonstrated. Given the focal in circumferential appearance of
this lesion, underlying colon cancer should also be excluded and
consider follow-up or colonoscopy after resolution of acute process.
Appendix is normal.

Vascular/Lymphatic: No significant vascular findings are present. No
enlarged abdominal or pelvic lymph nodes.

Reproductive: Prostate gland is enlarged, measuring 5.2 cm diameter.

Other: No free air or free fluid in the abdomen. Small periumbilical
hernia containing fat.

Musculoskeletal: No destructive bone lesions appreciated.
IMPRESSION: 1. Changes of acute diverticulitis in the mid sigmoid region. No
abscess. Underlying colon cancer should be excluded after resolution
of acute process.
2. Diffuse fatty infiltration of the liver.
3. Enlarged prostate gland.
4. Small periumbilical hernia containing fat.

## 2023-05-10 ENCOUNTER — Other Ambulatory Visit: Payer: Self-pay | Admitting: Family Medicine

## 2023-05-10 DIAGNOSIS — R1013 Epigastric pain: Secondary | ICD-10-CM

## 2023-05-12 ENCOUNTER — Other Ambulatory Visit: Payer: BC Managed Care – PPO

## 2023-05-19 ENCOUNTER — Ambulatory Visit
Admission: RE | Admit: 2023-05-19 | Discharge: 2023-05-19 | Disposition: A | Discharge status: Home / Self Care | Payer: Self-pay | Source: Ambulatory Visit | Attending: Family Medicine | Admitting: Family Medicine

## 2023-05-19 DIAGNOSIS — R1013 Epigastric pain: Secondary | ICD-10-CM
